# Patient Record
Sex: Female | Born: 1935 | Race: White | Hispanic: No | Marital: Married | Smoking: Never smoker
Health system: Southern US, Community
[De-identification: ages and names within clinical notes are randomized; demographics above are authoritative.]

## PROBLEM LIST (undated history)

## (undated) DIAGNOSIS — I639 Cerebral infarction, unspecified: Secondary | ICD-10-CM

## (undated) DIAGNOSIS — I10 Essential (primary) hypertension: Secondary | ICD-10-CM

## (undated) DIAGNOSIS — K5190 Ulcerative colitis, unspecified, without complications: Secondary | ICD-10-CM

## (undated) HISTORY — PX: TONSILLECTOMY: SUR1361

## (undated) HISTORY — PX: MASTECTOMY: SHX3

---

## 2000-06-23 ENCOUNTER — Ambulatory Visit (HOSPITAL_COMMUNITY): Admission: RE | Admit: 2000-06-23 | Discharge: 2000-06-23 | Payer: Self-pay | Admitting: Neurology

## 2000-06-23 ENCOUNTER — Encounter: Payer: Self-pay | Admitting: Neurology

## 2000-07-26 ENCOUNTER — Ambulatory Visit (HOSPITAL_COMMUNITY): Admission: RE | Admit: 2000-07-26 | Discharge: 2000-07-26 | Payer: Self-pay | Admitting: Neurology

## 2000-10-21 ENCOUNTER — Ambulatory Visit (HOSPITAL_COMMUNITY): Admission: RE | Admit: 2000-10-21 | Discharge: 2000-10-21 | Payer: Self-pay | Admitting: Neurosurgery

## 2000-10-21 ENCOUNTER — Encounter: Payer: Self-pay | Admitting: Neurosurgery

## 2001-04-26 ENCOUNTER — Encounter: Payer: Self-pay | Admitting: Neurology

## 2001-04-26 ENCOUNTER — Ambulatory Visit (HOSPITAL_COMMUNITY): Admission: RE | Admit: 2001-04-26 | Discharge: 2001-04-26 | Payer: Self-pay | Admitting: Neurology

## 2001-11-30 ENCOUNTER — Ambulatory Visit (HOSPITAL_COMMUNITY): Admission: RE | Admit: 2001-11-30 | Discharge: 2001-11-30 | Payer: Self-pay | Admitting: Interventional Radiology

## 2001-12-01 ENCOUNTER — Ambulatory Visit (HOSPITAL_COMMUNITY): Admission: RE | Admit: 2001-12-01 | Discharge: 2001-12-01 | Payer: Self-pay | Admitting: *Deleted

## 2002-01-31 ENCOUNTER — Inpatient Hospital Stay (HOSPITAL_COMMUNITY): Admission: AD | Admit: 2002-01-31 | Discharge: 2002-02-06 | Payer: Self-pay | Admitting: Interventional Radiology

## 2002-02-06 ENCOUNTER — Inpatient Hospital Stay (HOSPITAL_COMMUNITY)
Admission: AD | Admit: 2002-02-06 | Discharge: 2002-02-27 | Payer: Self-pay | Admitting: Physical Medicine & Rehabilitation

## 2013-12-29 ENCOUNTER — Emergency Department (HOSPITAL_BASED_OUTPATIENT_CLINIC_OR_DEPARTMENT_OTHER)
Admission: EM | Admit: 2013-12-29 | Discharge: 2013-12-29 | Disposition: A | Discharge status: Home / Self Care | Payer: Medicare Other | Attending: Emergency Medicine | Admitting: Emergency Medicine

## 2013-12-29 ENCOUNTER — Encounter (HOSPITAL_BASED_OUTPATIENT_CLINIC_OR_DEPARTMENT_OTHER): Payer: Self-pay | Admitting: Emergency Medicine

## 2013-12-29 ENCOUNTER — Emergency Department (HOSPITAL_BASED_OUTPATIENT_CLINIC_OR_DEPARTMENT_OTHER): Payer: Medicare Other

## 2013-12-29 DIAGNOSIS — Z7982 Long term (current) use of aspirin: Secondary | ICD-10-CM | POA: Insufficient documentation

## 2013-12-29 DIAGNOSIS — M26649 Arthritis of unspecified temporomandibular joint: Secondary | ICD-10-CM

## 2013-12-29 DIAGNOSIS — Z8673 Personal history of transient ischemic attack (TIA), and cerebral infarction without residual deficits: Secondary | ICD-10-CM | POA: Insufficient documentation

## 2013-12-29 DIAGNOSIS — R6884 Jaw pain: Secondary | ICD-10-CM | POA: Insufficient documentation

## 2013-12-29 DIAGNOSIS — Z79899 Other long term (current) drug therapy: Secondary | ICD-10-CM | POA: Insufficient documentation

## 2013-12-29 DIAGNOSIS — Z8719 Personal history of other diseases of the digestive system: Secondary | ICD-10-CM | POA: Insufficient documentation

## 2013-12-29 DIAGNOSIS — I10 Essential (primary) hypertension: Secondary | ICD-10-CM | POA: Insufficient documentation

## 2013-12-29 DIAGNOSIS — M2669 Other specified disorders of temporomandibular joint: Secondary | ICD-10-CM | POA: Insufficient documentation

## 2013-12-29 HISTORY — DX: Cerebral infarction, unspecified: I63.9

## 2013-12-29 HISTORY — DX: Essential (primary) hypertension: I10

## 2013-12-29 HISTORY — DX: Ulcerative colitis, unspecified, without complications: K51.90

## 2013-12-29 LAB — CBC WITH DIFFERENTIAL/PLATELET
Basophils Absolute: 0 10*3/uL (ref 0.0–0.1)
Basophils Relative: 0 % (ref 0–1)
Eosinophils Absolute: 0.1 10*3/uL (ref 0.0–0.7)
Eosinophils Relative: 2 % (ref 0–5)
HEMATOCRIT: 35.9 % — AB (ref 36.0–46.0)
Hemoglobin: 12.4 g/dL (ref 12.0–15.0)
Lymphocytes Relative: 36 % (ref 12–46)
Lymphs Abs: 2.8 10*3/uL (ref 0.7–4.0)
MCH: 31.3 pg (ref 26.0–34.0)
MCHC: 34.5 g/dL (ref 30.0–36.0)
MCV: 90.7 fL (ref 78.0–100.0)
MONO ABS: 0.6 10*3/uL (ref 0.1–1.0)
MONOS PCT: 8 % (ref 3–12)
NEUTROS ABS: 4.2 10*3/uL (ref 1.7–7.7)
NEUTROS PCT: 54 % (ref 43–77)
Platelets: 110 10*3/uL — ABNORMAL LOW (ref 150–400)
RBC: 3.96 MIL/uL (ref 3.87–5.11)
RDW: 13 % (ref 11.5–15.5)
WBC: 7.9 10*3/uL (ref 4.0–10.5)

## 2013-12-29 LAB — BASIC METABOLIC PANEL
BUN: 11 mg/dL (ref 6–23)
CHLORIDE: 98 meq/L (ref 96–112)
CO2: 24 mEq/L (ref 19–32)
Calcium: 9.2 mg/dL (ref 8.4–10.5)
Creatinine, Ser: 0.8 mg/dL (ref 0.50–1.10)
GFR calc Af Amer: 77 mL/min — ABNORMAL LOW (ref >90)
GFR calc non Af Amer: 67 mL/min — ABNORMAL LOW (ref >90)
GLUCOSE: 112 mg/dL — AB (ref 70–99)
POTASSIUM: 3.8 meq/L (ref 3.7–5.3)
Sodium: 139 mEq/L (ref 137–147)

## 2013-12-29 MED ORDER — IOHEXOL 300 MG/ML  SOLN
100.0000 mL | Freq: Once | INTRAMUSCULAR | Status: AC | PRN
  Administered 2013-12-29: 100 mL via INTRAVENOUS

## 2013-12-29 MED ORDER — TRAMADOL HCL 50 MG PO TABS
50.0000 mg | ORAL_TABLET | Freq: Once | ORAL | Status: AC
  Administered 2013-12-29: 50 mg via ORAL
  Filled 2013-12-29: qty 1

## 2013-12-29 NOTE — ED Notes (Signed)
C/o intermittent right sided jaw pain.  Reports feeling like 'lightening.'  Denies chest pain, diaphoresis, SHOB, or headache.

## 2013-12-29 NOTE — Discharge Instructions (Signed)
Arthralgia °Your caregiver has diagnosed you as suffering from an arthralgia. Arthralgia means there is pain in a joint. This can come from many reasons including: °· Bruising the joint which causes soreness (inflammation) in the joint. °· Wear and tear on the joints which occur as we grow older (osteoarthritis). °· Overusing the joint. °· Various forms of arthritis. °· Infections of the joint. °Regardless of the cause of pain in your joint, most of these different pains respond to anti-inflammatory drugs and rest. The exception to this is when a joint is infected, and these cases are treated with antibiotics, if it is a bacterial infection. °HOME CARE INSTRUCTIONS  °· Rest the injured area for as long as directed by your caregiver. Then slowly start using the joint as directed by your caregiver and as the pain allows. Crutches as directed may be useful if the ankles, knees or hips are involved. If the knee was splinted or casted, continue use and care as directed. If an stretchy or elastic wrapping bandage has been applied today, it should be removed and re-applied every 3 to 4 hours. It should not be applied tightly, but firmly enough to keep swelling down. Watch toes and feet for swelling, bluish discoloration, coldness, numbness or excessive pain. If any of these problems (symptoms) occur, remove the ace bandage and re-apply more loosely. If these symptoms persist, contact your caregiver or return to this location. °· For the first 24 hours, keep the injured extremity elevated on pillows while lying down. °· Apply ice for 15-20 minutes to the sore joint every couple hours while awake for the first half day. Then 03-04 times per day for the first 48 hours. Put the ice in a plastic bag and place a towel between the bag of ice and your skin. °· Wear any splinting, casting, elastic bandage applications, or slings as instructed. °· Only take over-the-counter or prescription medicines for pain, discomfort, or fever as  directed by your caregiver. Do not use aspirin immediately after the injury unless instructed by your physician. Aspirin can cause increased bleeding and bruising of the tissues. °· If you were given crutches, continue to use them as instructed and do not resume weight bearing on the sore joint until instructed. °Persistent pain and inability to use the sore joint as directed for more than 2 to 3 days are warning signs indicating that you should see a caregiver for a follow-up visit as soon as possible. Initially, a hairline fracture (break in bone) may not be evident on X-rays. Persistent pain and swelling indicate that further evaluation, non-weight bearing or use of the joint (use of crutches or slings as instructed), or further X-rays are indicated. X-rays may sometimes not show a small fracture until a week or 10 days later. Make a follow-up appointment with your own caregiver or one to whom we have referred you. A radiologist (specialist in reading X-rays) may read your X-rays. Make sure you know how you are to obtain your X-ray results. Do not assume everything is normal if you do not hear from us. °SEEK MEDICAL CARE IF: °Bruising, swelling, or pain increases. °SEEK IMMEDIATE MEDICAL CARE IF:  °· Your fingers or toes are numb or blue. °· The pain is not responding to medications and continues to stay the same or get worse. °· The pain in your joint becomes severe. °· You develop a fever over 102° F (38.9° C). °· It becomes impossible to move or use the joint. °MAKE SURE YOU:  °·   Understand these instructions.  Will watch your condition.  Will get help right away if you are not doing well or get worse. Document Released: 10/04/2005 Document Revised: 12/27/2011 Document Reviewed: 05/22/2008 [facility] Patient Information 2014 [place].    Dental Care and Dentist Visits Dental care supports good overall health. Regular dental visits can also help you avoid dental pain, bleeding, infection, and  other more serious health problems in the future. It is important to keep the mouth healthy because diseases in the teeth, gums, and other oral tissues can spread to other areas of the body. Some problems, such as diabetes, heart disease, and pre-term labor have been associated with poor oral health.  See your dentist every 6 months. If you experience emergency problems such as a toothache or broken tooth, go to the dentist right away. If you see your dentist regularly, you may catch problems early. It is easier to be treated for problems in the early stages.  WHAT TO EXPECT AT A DENTIST VISIT  Your dentist will look for many common oral health problems and recommend proper treatment. At your regular dental visit, you can expect:  Gentle cleaning of the teeth and gums. This includes scraping and polishing. This helps to remove the sticky substance around the teeth and gums (plaque). Plaque forms in the mouth shortly after eating. Over time, plaque hardens on the teeth as tartar. If tartar is not removed regularly, it can cause problems. Cleaning also helps remove stains.  Periodic X-rays. These pictures of the teeth and supporting bone will help your dentist assess the health of your teeth.  Periodic fluoride treatments. Fluoride is a natural mineral shown to help strengthen teeth. Fluoride treatmentinvolves applying a fluoride gel or varnish to the teeth. It is most commonly done in children.  Examination of the mouth, tongue, jaws, teeth, and gums to look for any oral health problems, such as:  Cavities (dental caries). This is decay on the tooth caused by plaque, sugar, and acid in the mouth. It is best to catch a cavity when it is small.  Inflammation of the gums caused by plaque buildup (gingivitis).  Problems with the mouth or malformed or misaligned teeth.  Oral cancer or other diseases of the soft tissues or jaws. KEEP YOUR TEETH AND GUMS HEALTHY For healthy teeth and gums, follow  these general guidelines as well as your dentist's specific advice:  Have your teeth professionally cleaned at the dentist every 6 months.  Brush twice daily with a fluoride toothpaste.  Floss your teeth daily.  Ask your dentist if you need fluoride supplements, treatments, or fluoride toothpaste.  Eat a healthy diet. Reduce foods and drinks with added sugar.  Avoid smoking. TREATMENT FOR ORAL HEALTH PROBLEMS If you have oral health problems, treatment varies depending on the conditions present in your teeth and gums.  Your caregiver will most likely recommend good oral hygiene at each visit.  For cavities, gingivitis, or other oral health disease, your caregiver will perform a procedure to treat the problem. This is typically done at a separate appointment. Sometimes your caregiver will refer you to another dental specialist for specific tooth problems or for surgery. SEEK IMMEDIATE DENTAL CARE IF:  You have pain, bleeding, or soreness in the gum, tooth, jaw, or mouth area.  A permanent tooth becomes loose or separated from the gum socket.  You experience a blow or injury to the mouth or jaw area. Document Released: 06/16/2011 Document Revised: 12/27/2011 Document Reviewed: 06/16/2011 [employer] Patient  Information ©2014 [employer]. ° °

## 2013-12-29 NOTE — ED Provider Notes (Signed)
CSN: [id number]     Arrival date & time 12/29/13  1349 History  This chart was scribed for Ephraim Hamburger, MD by Mercy Moore, ED scribe.  This patient was seen in room MH12/MH12 and the patient's care was started at 3:21 PM.   Chief Complaint  Patient presents with  . Jaw Pain      HPI HPI Comments: Elizabeth Valdez is a 80 y.o. female who presents to the Emergency Department complaining of right sided jaw pain, onset three days. Patient reports that the pain has been intermittent until today. Today the pain has severely worsened and has become constant. Patient reports having similar pain months ago and a dentist diagnosed her "jaw joint pain." Patient reports having arthritis and suspects that it may be contributing to her jaw pain. Patient denies redness or swelling of the area, pain in her teeth, and pain with movement, chewing or swallowing. However, patient states her dentist advised her not to chew during her previous episode. Patient reports that a heating pad and warm food does alleviate her pain. Yesterday, Ultram aided in mitigating her pain but has not provided any relief today. No exertional pain. No chest pain, shortness of breath, nausea or diaphoresis.  Past Medical History  Diagnosis Date  . Hypertension   . Ulcerative colitis   . Stroke    Past Surgical History  Procedure Laterality Date  . Mastectomy    . Tonsillectomy     No family history on file. History  Substance Use Topics  . Smoking status: Never Smoker   . Smokeless tobacco: Not on file  . Alcohol Use: No   OB History   Grav Para Term Preterm Abortions TAB SAB Ect Mult Living                 Review of Systems  Constitutional: Negative for fever, chills and diaphoresis.  HENT: Negative for sore throat and trouble swallowing.   Respiratory: Negative for cough and shortness of breath.   Cardiovascular: Negative for chest pain.  Gastrointestinal: Negative for nausea.  All other systems reviewed and  are negative.      Allergies  Codeine; Sulfa antibiotics; and Talwin  Home Medications   Current Outpatient Rx  Name  Route  Sig  Dispense  Refill  . albuterol (PROVENTIL) (2.5 MG/3ML) 0.083% nebulizer solution   Nebulization   Take 2.5 mg by nebulization every 6 (six) hours as needed for wheezing or shortness of breath.         Marland Kitchen aspirin 81 MG tablet   Oral   Take 81 mg by mouth daily.         . cetirizine (ZYRTEC) 10 MG tablet   Oral   Take 10 mg by mouth daily.         . clotrimazole (LOTRIMIN) 1 % cream   Topical   Apply 1 application topically 2 (two) times daily.         . furosemide (LASIX) 40 MG tablet   Oral   Take 40 mg by mouth daily.         . hydrALAZINE (APRESOLINE) 25 MG tablet   Oral   Take 25 mg by mouth 2 (two) times daily.         . hydrochlorothiazide (MICROZIDE) 12.5 MG capsule   Oral   Take 12.5 mg by mouth daily.         . irbesartan (AVAPRO) 300 MG tablet   Oral   Take 300  mg by mouth daily.         . metoprolol succinate (TOPROL-XL) 100 MG 24 hr tablet   Oral   Take 150 mg by mouth daily. Take with or immediately following a meal.         . naproxen sodium (ANAPROX) 220 MG tablet   Oral   Take 220 mg by mouth 2 (two) times daily with a meal.         . omeprazole (PRILOSEC) 20 MG capsule   Oral   Take 20 mg by mouth daily.         . potassium chloride SA (K-DUR,KLOR-CON) 20 MEQ tablet   Oral   Take 20 mEq by mouth 2 (two) times daily.         . sertraline (ZOLOFT) 50 MG tablet   Oral   Take 50 mg by mouth daily.         . simvastatin (ZOCOR) 20 MG tablet   Oral   Take 20 mg by mouth daily.         . traMADol (ULTRAM) 50 MG tablet   Oral   Take 50 mg by mouth every 6 (six) hours as needed.         . travoprost, benzalkonium, (TRAVATAN) 0.004 % ophthalmic solution   Both Eyes   Place 1 drop into both eyes at bedtime.          Triage Vitals: BP 184/105  Temp(Src) 98.1 F (36.7 C)  (Oral)  Resp 18  Ht 5\' 2"  (1.575 m)  Wt 168 lb (76.204 kg)  BMI 30.72 kg/m2  SpO2 92% Physical Exam  Nursing note and vitals reviewed. Constitutional: She is oriented to person, place, and time. She appears well-developed and well-nourished. No distress.  HENT:  Head: Normocephalic and atraumatic.  Right mandibular tenderness. Generally poor dentition with multiple caries and fillings. No acute abscess, erythema or tenderness  Neck: Neck supple. No tracheal deviation present.  Cardiovascular: Normal rate, regular rhythm and normal heart sounds.   Pulmonary/Chest: Effort normal and breath sounds normal. No respiratory distress.  Neurological: She is alert and oriented to person, place, and time.  Skin: Skin is warm and dry. No erythema.  Psychiatric: She has a normal mood and affect. Her behavior is normal.    ED Course  Procedures (including critical care time) DIAGNOSTIC STUDIES: Oxygen Saturation is 92% on room air, low by my interpretation.    COORDINATION OF CARE: 3:27 PM- Will order CT scan to rule out possible infection, IV fluids, and labs. Pt advised of plan for treatment and pt agrees.    Labs Review Labs Reviewed  CBC WITH DIFFERENTIAL - Abnormal; Notable for the following:    HCT 35.9 (*)    Platelets 110 (*)    All other components within normal limits  BASIC METABOLIC PANEL - Abnormal; Notable for the following:    Glucose, Bld 112 (*)    GFR calc non Af Amer 67 (*)    GFR calc Af Amer 77 (*)    All other components within normal limits   Imaging Review Ct Maxillofacial W/cm  12/29/2013   CLINICAL DATA:  Right jaw pain. Normal white blood count. 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  EXAM: CT MAXILLOFACIAL WITH CONTRAST  TECHNIQUE: Multidetector CT imaging of the maxillofacial structures was performed with intravenous contrast. Multiplanar CT image reconstructions were also generated. A small metallic BB was  placed  on the right temple in order to reliably differentiate right from left.  CONTRAST:  183mL OMNIPAQUE IOHEXOL 300 MG/ML  SOLN  COMPARISON:  MRI a 02/03/2012 and head CT 02/03/2012  FINDINGS: There is streak artifact from multiple dental fillings. Both mandibular condyles are located. There is some flattening of the condylar process of the right mandible with some subchondral sclerosis suggesting temporomandibular joint degenerative disease. The mandible shows no evidence of cortical destruction, erosion, or fracture. No periapical lucencies are seen in the mandible. There is a periapical lucency about a left maxillary molar. See image number 31 of the coronal images and image 39 of axial images.  No inflammatory changes are seen within the soft tissues about the maxilla or mandible or remainder of the face.  There are no air-fluid levels or significant disease in the paranasal sinuses. There is scattered ethmoid and left maxillary and mucosal thickening. The mastoid air cells and middle ears are clear. The orbits are symmetric and unremarkable.  There is an approximately 7.3 x 8 mm aneurysm extending from the A1 segment of the right anterior cerebral artery. On an MRA dated 02/03/2012, this was described as being 7 mm and is therefore very similar in size. Negative for hydrocephalus or midline shift. Cerebral atrophy is noted.  IMPRESSION: 1.  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 2. No focal inflammatory changes. No evidence of acute fracture or bony destruction. 3. Periapical lucency of a left maxillary molar. 4. The patient's known right anterior cerebral artery (A1 segment) aneurysm measures 7 x 8 mm on today's study. This is without significant change compared to an MRA dated 02/03/2012.   Electronically Signed   By: Curlene Dolphin M.D.   On: 12/29/2013 17:37     EKG Interpretation   Date/Time:  Saturday December 29 2013 14:32:30 EDT Ventricular Rate:  66 PR Interval:  150 QRS  Duration: 114 QT Interval:  478 QTC Calculation: 501 R Axis:   76 Text Interpretation:  Normal sinus rhythm Septal infarct , age  undetermined ST \\T \ T wave abnormality, consider inferior ischemia  prolonged QTC Abnormal ECG Left bundle branch block is new since 2003  Confirmed by Anela Bensman  MD, Nicollette Wilhelmi ([id number]) on 12/29/2013 3:08:10 PM      MDM   Final diagnoses:  Jaw pain  TMJ arthritis    Patient is well appearing with point tenderness over mid right mandible. Burtis Junes this is arthritic in nature. Given her poor dentition and acute worsening, a CT was evaluated to r/o infection or osteo. CT shows TMJ arthritis. The left upper molar with periapical luceny has benign appearance on re-exam, no tenderness or pain there. She has chronic problems with that tooth and is following up soon with her dentist. No signs of other acute periodontal disease. Will use heat packs with caution and continue tramadol as needed for pain. There is no acute ischemia on her EKG and her history and exam point to this being MSK in nature, not ACS.  I personally performed the services described in this documentation, which was scribed in my presence. The recorded information has been reviewed and is accurate.    Ephraim Hamburger, MD 12/30/13 [phone]

## 2016-12-21 DIAGNOSIS — R197 Diarrhea, unspecified: Secondary | ICD-10-CM | POA: Diagnosis not present

## 2016-12-21 DIAGNOSIS — R278 Other lack of coordination: Secondary | ICD-10-CM | POA: Diagnosis not present

## 2016-12-21 DIAGNOSIS — R293 Abnormal posture: Secondary | ICD-10-CM | POA: Diagnosis not present

## 2016-12-21 DIAGNOSIS — I69154 Hemiplegia and hemiparesis following nontraumatic intracerebral hemorrhage affecting left non-dominant side: Secondary | ICD-10-CM | POA: Diagnosis not present

## 2016-12-21 DIAGNOSIS — M12812 Other specific arthropathies, not elsewhere classified, left shoulder: Secondary | ICD-10-CM | POA: Diagnosis not present

## 2017-01-02 DIAGNOSIS — I214 Non-ST elevation (NSTEMI) myocardial infarction: Secondary | ICD-10-CM | POA: Diagnosis not present

## 2017-01-02 DIAGNOSIS — Z8673 Personal history of transient ischemic attack (TIA), and cerebral infarction without residual deficits: Secondary | ICD-10-CM | POA: Diagnosis not present

## 2017-01-02 DIAGNOSIS — K219 Gastro-esophageal reflux disease without esophagitis: Secondary | ICD-10-CM | POA: Diagnosis not present

## 2017-01-02 DIAGNOSIS — Z9842 Cataract extraction status, left eye: Secondary | ICD-10-CM | POA: Diagnosis not present

## 2017-01-02 DIAGNOSIS — M1990 Unspecified osteoarthritis, unspecified site: Secondary | ICD-10-CM | POA: Diagnosis not present

## 2017-01-02 DIAGNOSIS — E782 Mixed hyperlipidemia: Secondary | ICD-10-CM | POA: Diagnosis not present

## 2017-01-02 DIAGNOSIS — R0602 Shortness of breath: Secondary | ICD-10-CM | POA: Diagnosis not present

## 2017-01-02 DIAGNOSIS — Z7982 Long term (current) use of aspirin: Secondary | ICD-10-CM | POA: Diagnosis not present

## 2017-01-02 DIAGNOSIS — Z6826 Body mass index (BMI) 26.0-26.9, adult: Secondary | ICD-10-CM | POA: Diagnosis not present

## 2017-01-02 DIAGNOSIS — Z993 Dependence on wheelchair: Secondary | ICD-10-CM | POA: Diagnosis not present

## 2017-01-02 DIAGNOSIS — I69352 Hemiplegia and hemiparesis following cerebral infarction affecting left dominant side: Secondary | ICD-10-CM | POA: Diagnosis not present

## 2017-01-02 DIAGNOSIS — R197 Diarrhea, unspecified: Secondary | ICD-10-CM | POA: Diagnosis not present

## 2017-01-02 DIAGNOSIS — E119 Type 2 diabetes mellitus without complications: Secondary | ICD-10-CM | POA: Diagnosis not present

## 2017-01-02 DIAGNOSIS — I10 Essential (primary) hypertension: Secondary | ICD-10-CM | POA: Diagnosis not present

## 2017-01-02 DIAGNOSIS — F4323 Adjustment disorder with mixed anxiety and depressed mood: Secondary | ICD-10-CM | POA: Diagnosis not present

## 2017-01-02 DIAGNOSIS — Z9012 Acquired absence of left breast and nipple: Secondary | ICD-10-CM | POA: Diagnosis not present

## 2017-01-02 DIAGNOSIS — M810 Age-related osteoporosis without current pathological fracture: Secondary | ICD-10-CM | POA: Diagnosis not present

## 2017-01-02 DIAGNOSIS — Z853 Personal history of malignant neoplasm of breast: Secondary | ICD-10-CM | POA: Diagnosis not present

## 2017-01-02 DIAGNOSIS — E1139 Type 2 diabetes mellitus with other diabetic ophthalmic complication: Secondary | ICD-10-CM | POA: Diagnosis not present

## 2017-01-02 DIAGNOSIS — H409 Unspecified glaucoma: Secondary | ICD-10-CM | POA: Diagnosis not present

## 2017-01-02 DIAGNOSIS — I161 Hypertensive emergency: Secondary | ICD-10-CM | POA: Diagnosis not present

## 2017-01-02 DIAGNOSIS — Z9841 Cataract extraction status, right eye: Secondary | ICD-10-CM | POA: Diagnosis not present

## 2017-01-02 DIAGNOSIS — I119 Hypertensive heart disease without heart failure: Secondary | ICD-10-CM | POA: Diagnosis not present

## 2017-01-02 DIAGNOSIS — Z961 Presence of intraocular lens: Secondary | ICD-10-CM | POA: Diagnosis not present

## 2017-01-02 DIAGNOSIS — Z79818 Long term (current) use of other agents affecting estrogen receptors and estrogen levels: Secondary | ICD-10-CM | POA: Diagnosis not present

## 2017-01-02 DIAGNOSIS — R079 Chest pain, unspecified: Secondary | ICD-10-CM | POA: Diagnosis not present

## 2017-01-03 DIAGNOSIS — I119 Hypertensive heart disease without heart failure: Secondary | ICD-10-CM | POA: Diagnosis not present

## 2017-01-03 DIAGNOSIS — E119 Type 2 diabetes mellitus without complications: Secondary | ICD-10-CM | POA: Diagnosis not present

## 2017-01-03 DIAGNOSIS — I214 Non-ST elevation (NSTEMI) myocardial infarction: Secondary | ICD-10-CM | POA: Diagnosis not present

## 2017-01-03 DIAGNOSIS — I517 Cardiomegaly: Secondary | ICD-10-CM | POA: Diagnosis not present

## 2017-01-03 DIAGNOSIS — I639 Cerebral infarction, unspecified: Secondary | ICD-10-CM | POA: Diagnosis not present

## 2017-01-03 DIAGNOSIS — I2119 ST elevation (STEMI) myocardial infarction involving other coronary artery of inferior wall: Secondary | ICD-10-CM | POA: Diagnosis not present

## 2017-01-03 DIAGNOSIS — I4581 Long QT syndrome: Secondary | ICD-10-CM | POA: Diagnosis not present

## 2017-01-03 DIAGNOSIS — I340 Nonrheumatic mitral (valve) insufficiency: Secondary | ICD-10-CM | POA: Diagnosis not present

## 2017-01-04 DIAGNOSIS — Z6824 Body mass index (BMI) 24.0-24.9, adult: Secondary | ICD-10-CM | POA: Diagnosis not present

## 2017-01-04 DIAGNOSIS — R197 Diarrhea, unspecified: Secondary | ICD-10-CM | POA: Diagnosis not present

## 2017-01-04 DIAGNOSIS — K219 Gastro-esophageal reflux disease without esophagitis: Secondary | ICD-10-CM | POA: Diagnosis not present

## 2017-01-04 DIAGNOSIS — I214 Non-ST elevation (NSTEMI) myocardial infarction: Secondary | ICD-10-CM | POA: Diagnosis not present

## 2017-01-04 DIAGNOSIS — I69359 Hemiplegia and hemiparesis following cerebral infarction affecting unspecified side: Secondary | ICD-10-CM | POA: Diagnosis not present

## 2017-01-04 DIAGNOSIS — I10 Essential (primary) hypertension: Secondary | ICD-10-CM | POA: Diagnosis not present

## 2017-01-04 DIAGNOSIS — F4323 Adjustment disorder with mixed anxiety and depressed mood: Secondary | ICD-10-CM | POA: Diagnosis not present

## 2017-01-04 DIAGNOSIS — I161 Hypertensive emergency: Secondary | ICD-10-CM | POA: Diagnosis not present

## 2017-01-04 DIAGNOSIS — Z8673 Personal history of transient ischemic attack (TIA), and cerebral infarction without residual deficits: Secondary | ICD-10-CM | POA: Diagnosis not present

## 2017-01-04 DIAGNOSIS — I119 Hypertensive heart disease without heart failure: Secondary | ICD-10-CM | POA: Diagnosis not present

## 2017-01-04 DIAGNOSIS — G8190 Hemiplegia, unspecified affecting unspecified side: Secondary | ICD-10-CM | POA: Diagnosis not present

## 2017-01-04 DIAGNOSIS — E119 Type 2 diabetes mellitus without complications: Secondary | ICD-10-CM | POA: Diagnosis not present

## 2017-01-12 DIAGNOSIS — I10 Essential (primary) hypertension: Secondary | ICD-10-CM | POA: Diagnosis not present

## 2017-01-12 DIAGNOSIS — K219 Gastro-esophageal reflux disease without esophagitis: Secondary | ICD-10-CM | POA: Diagnosis not present

## 2017-01-12 DIAGNOSIS — I69359 Hemiplegia and hemiparesis following cerebral infarction affecting unspecified side: Secondary | ICD-10-CM | POA: Diagnosis not present

## 2017-01-12 DIAGNOSIS — K52831 Collagenous colitis: Secondary | ICD-10-CM | POA: Diagnosis not present

## 2017-01-12 DIAGNOSIS — I252 Old myocardial infarction: Secondary | ICD-10-CM | POA: Diagnosis not present

## 2017-01-20 DIAGNOSIS — M542 Cervicalgia: Secondary | ICD-10-CM | POA: Diagnosis not present

## 2017-01-20 DIAGNOSIS — G8194 Hemiplegia, unspecified affecting left nondominant side: Secondary | ICD-10-CM | POA: Diagnosis not present

## 2017-01-20 DIAGNOSIS — I69359 Hemiplegia and hemiparesis following cerebral infarction affecting unspecified side: Secondary | ICD-10-CM | POA: Diagnosis not present

## 2017-01-20 DIAGNOSIS — Z6826 Body mass index (BMI) 26.0-26.9, adult: Secondary | ICD-10-CM | POA: Diagnosis not present

## 2017-01-20 DIAGNOSIS — Z8679 Personal history of other diseases of the circulatory system: Secondary | ICD-10-CM | POA: Diagnosis not present

## 2017-01-20 DIAGNOSIS — I252 Old myocardial infarction: Secondary | ICD-10-CM | POA: Diagnosis not present

## 2017-01-20 DIAGNOSIS — I214 Non-ST elevation (NSTEMI) myocardial infarction: Secondary | ICD-10-CM | POA: Diagnosis not present

## 2017-01-20 DIAGNOSIS — G8190 Hemiplegia, unspecified affecting unspecified side: Secondary | ICD-10-CM | POA: Diagnosis not present

## 2017-01-20 DIAGNOSIS — E782 Mixed hyperlipidemia: Secondary | ICD-10-CM | POA: Diagnosis not present

## 2017-01-20 DIAGNOSIS — S92811D Other fracture of right foot, subsequent encounter for fracture with routine healing: Secondary | ICD-10-CM | POA: Diagnosis not present

## 2017-01-20 DIAGNOSIS — I10 Essential (primary) hypertension: Secondary | ICD-10-CM | POA: Diagnosis not present

## 2017-01-20 DIAGNOSIS — R0689 Other abnormalities of breathing: Secondary | ICD-10-CM | POA: Diagnosis not present

## 2017-01-24 DIAGNOSIS — E785 Hyperlipidemia, unspecified: Secondary | ICD-10-CM | POA: Diagnosis not present

## 2017-01-24 DIAGNOSIS — K769 Liver disease, unspecified: Secondary | ICD-10-CM | POA: Diagnosis not present

## 2017-02-19 DIAGNOSIS — R0689 Other abnormalities of breathing: Secondary | ICD-10-CM | POA: Diagnosis not present

## 2017-02-19 DIAGNOSIS — G8194 Hemiplegia, unspecified affecting left nondominant side: Secondary | ICD-10-CM | POA: Diagnosis not present

## 2017-02-19 DIAGNOSIS — I69359 Hemiplegia and hemiparesis following cerebral infarction affecting unspecified side: Secondary | ICD-10-CM | POA: Diagnosis not present

## 2017-02-19 DIAGNOSIS — M542 Cervicalgia: Secondary | ICD-10-CM | POA: Diagnosis not present

## 2017-02-19 DIAGNOSIS — I252 Old myocardial infarction: Secondary | ICD-10-CM | POA: Diagnosis not present

## 2017-02-19 DIAGNOSIS — S92811D Other fracture of right foot, subsequent encounter for fracture with routine healing: Secondary | ICD-10-CM | POA: Diagnosis not present

## 2017-02-22 DIAGNOSIS — H04123 Dry eye syndrome of bilateral lacrimal glands: Secondary | ICD-10-CM | POA: Diagnosis not present

## 2017-02-22 DIAGNOSIS — Z961 Presence of intraocular lens: Secondary | ICD-10-CM | POA: Diagnosis not present

## 2017-02-22 DIAGNOSIS — H401134 Primary open-angle glaucoma, bilateral, indeterminate stage: Secondary | ICD-10-CM | POA: Diagnosis not present

## 2017-03-21 DIAGNOSIS — M12811 Other specific arthropathies, not elsewhere classified, right shoulder: Secondary | ICD-10-CM | POA: Diagnosis not present

## 2017-03-21 DIAGNOSIS — M12812 Other specific arthropathies, not elsewhere classified, left shoulder: Secondary | ICD-10-CM | POA: Diagnosis not present

## 2017-03-22 DIAGNOSIS — R0689 Other abnormalities of breathing: Secondary | ICD-10-CM | POA: Diagnosis not present

## 2017-03-22 DIAGNOSIS — M542 Cervicalgia: Secondary | ICD-10-CM | POA: Diagnosis not present

## 2017-03-22 DIAGNOSIS — I252 Old myocardial infarction: Secondary | ICD-10-CM | POA: Diagnosis not present

## 2017-03-22 DIAGNOSIS — G8194 Hemiplegia, unspecified affecting left nondominant side: Secondary | ICD-10-CM | POA: Diagnosis not present

## 2017-03-22 DIAGNOSIS — I69359 Hemiplegia and hemiparesis following cerebral infarction affecting unspecified side: Secondary | ICD-10-CM | POA: Diagnosis not present

## 2017-03-22 DIAGNOSIS — S92811D Other fracture of right foot, subsequent encounter for fracture with routine healing: Secondary | ICD-10-CM | POA: Diagnosis not present

## 2017-04-14 DIAGNOSIS — E871 Hypo-osmolality and hyponatremia: Secondary | ICD-10-CM | POA: Diagnosis not present

## 2017-04-14 DIAGNOSIS — G8194 Hemiplegia, unspecified affecting left nondominant side: Secondary | ICD-10-CM | POA: Diagnosis not present

## 2017-04-14 DIAGNOSIS — I10 Essential (primary) hypertension: Secondary | ICD-10-CM | POA: Diagnosis not present

## 2017-04-14 DIAGNOSIS — I252 Old myocardial infarction: Secondary | ICD-10-CM | POA: Diagnosis not present

## 2017-04-21 DIAGNOSIS — G8194 Hemiplegia, unspecified affecting left nondominant side: Secondary | ICD-10-CM | POA: Diagnosis not present

## 2017-04-21 DIAGNOSIS — I69359 Hemiplegia and hemiparesis following cerebral infarction affecting unspecified side: Secondary | ICD-10-CM | POA: Diagnosis not present

## 2017-04-21 DIAGNOSIS — I252 Old myocardial infarction: Secondary | ICD-10-CM | POA: Diagnosis not present

## 2017-04-21 DIAGNOSIS — S92811D Other fracture of right foot, subsequent encounter for fracture with routine healing: Secondary | ICD-10-CM | POA: Diagnosis not present

## 2017-04-21 DIAGNOSIS — R0689 Other abnormalities of breathing: Secondary | ICD-10-CM | POA: Diagnosis not present

## 2017-04-21 DIAGNOSIS — M542 Cervicalgia: Secondary | ICD-10-CM | POA: Diagnosis not present

## 2017-05-03 DIAGNOSIS — I2510 Atherosclerotic heart disease of native coronary artery without angina pectoris: Secondary | ICD-10-CM | POA: Diagnosis not present

## 2017-05-03 DIAGNOSIS — I252 Old myocardial infarction: Secondary | ICD-10-CM | POA: Diagnosis not present

## 2017-05-03 DIAGNOSIS — E782 Mixed hyperlipidemia: Secondary | ICD-10-CM | POA: Diagnosis not present

## 2017-05-03 DIAGNOSIS — I10 Essential (primary) hypertension: Secondary | ICD-10-CM | POA: Diagnosis not present

## 2017-05-03 DIAGNOSIS — G453 Amaurosis fugax: Secondary | ICD-10-CM | POA: Diagnosis not present

## 2017-05-03 DIAGNOSIS — I69359 Hemiplegia and hemiparesis following cerebral infarction affecting unspecified side: Secondary | ICD-10-CM | POA: Diagnosis not present

## 2017-05-03 DIAGNOSIS — G8190 Hemiplegia, unspecified affecting unspecified side: Secondary | ICD-10-CM | POA: Diagnosis not present

## 2017-05-04 DIAGNOSIS — L820 Inflamed seborrheic keratosis: Secondary | ICD-10-CM | POA: Diagnosis not present

## 2017-05-04 DIAGNOSIS — L821 Other seborrheic keratosis: Secondary | ICD-10-CM | POA: Diagnosis not present

## 2017-05-04 DIAGNOSIS — D485 Neoplasm of uncertain behavior of skin: Secondary | ICD-10-CM | POA: Diagnosis not present

## 2017-05-04 DIAGNOSIS — C44622 Squamous cell carcinoma of skin of right upper limb, including shoulder: Secondary | ICD-10-CM | POA: Diagnosis not present

## 2017-05-04 DIAGNOSIS — L814 Other melanin hyperpigmentation: Secondary | ICD-10-CM | POA: Diagnosis not present

## 2017-05-11 DIAGNOSIS — R1312 Dysphagia, oropharyngeal phase: Secondary | ICD-10-CM | POA: Diagnosis not present

## 2017-05-11 DIAGNOSIS — R1310 Dysphagia, unspecified: Secondary | ICD-10-CM | POA: Diagnosis not present

## 2017-05-11 DIAGNOSIS — R2689 Other abnormalities of gait and mobility: Secondary | ICD-10-CM | POA: Diagnosis not present

## 2017-05-11 DIAGNOSIS — M6281 Muscle weakness (generalized): Secondary | ICD-10-CM | POA: Diagnosis not present

## 2017-05-22 DIAGNOSIS — R0689 Other abnormalities of breathing: Secondary | ICD-10-CM | POA: Diagnosis not present

## 2017-05-22 DIAGNOSIS — M542 Cervicalgia: Secondary | ICD-10-CM | POA: Diagnosis not present

## 2017-05-22 DIAGNOSIS — S92811D Other fracture of right foot, subsequent encounter for fracture with routine healing: Secondary | ICD-10-CM | POA: Diagnosis not present

## 2017-05-22 DIAGNOSIS — I69359 Hemiplegia and hemiparesis following cerebral infarction affecting unspecified side: Secondary | ICD-10-CM | POA: Diagnosis not present

## 2017-05-22 DIAGNOSIS — G8194 Hemiplegia, unspecified affecting left nondominant side: Secondary | ICD-10-CM | POA: Diagnosis not present

## 2017-05-22 DIAGNOSIS — I252 Old myocardial infarction: Secondary | ICD-10-CM | POA: Diagnosis not present

## 2017-06-01 DIAGNOSIS — C44622 Squamous cell carcinoma of skin of right upper limb, including shoulder: Secondary | ICD-10-CM | POA: Diagnosis not present

## 2017-06-22 DIAGNOSIS — S92811D Other fracture of right foot, subsequent encounter for fracture with routine healing: Secondary | ICD-10-CM | POA: Diagnosis not present

## 2017-06-22 DIAGNOSIS — I69359 Hemiplegia and hemiparesis following cerebral infarction affecting unspecified side: Secondary | ICD-10-CM | POA: Diagnosis not present

## 2017-06-22 DIAGNOSIS — R0689 Other abnormalities of breathing: Secondary | ICD-10-CM | POA: Diagnosis not present

## 2017-06-22 DIAGNOSIS — G8194 Hemiplegia, unspecified affecting left nondominant side: Secondary | ICD-10-CM | POA: Diagnosis not present

## 2017-06-22 DIAGNOSIS — M542 Cervicalgia: Secondary | ICD-10-CM | POA: Diagnosis not present

## 2017-06-22 DIAGNOSIS — I252 Old myocardial infarction: Secondary | ICD-10-CM | POA: Diagnosis not present

## 2017-06-28 DIAGNOSIS — M12812 Other specific arthropathies, not elsewhere classified, left shoulder: Secondary | ICD-10-CM | POA: Diagnosis not present

## 2017-06-28 DIAGNOSIS — M75101 Unspecified rotator cuff tear or rupture of right shoulder, not specified as traumatic: Secondary | ICD-10-CM | POA: Diagnosis not present

## 2017-06-28 DIAGNOSIS — M12811 Other specific arthropathies, not elsewhere classified, right shoulder: Secondary | ICD-10-CM | POA: Diagnosis not present

## 2017-07-15 DIAGNOSIS — E871 Hypo-osmolality and hyponatremia: Secondary | ICD-10-CM | POA: Diagnosis not present

## 2017-07-15 DIAGNOSIS — I10 Essential (primary) hypertension: Secondary | ICD-10-CM | POA: Diagnosis not present

## 2017-07-15 DIAGNOSIS — G8194 Hemiplegia, unspecified affecting left nondominant side: Secondary | ICD-10-CM | POA: Diagnosis not present

## 2017-07-22 DIAGNOSIS — I69359 Hemiplegia and hemiparesis following cerebral infarction affecting unspecified side: Secondary | ICD-10-CM | POA: Diagnosis not present

## 2017-07-22 DIAGNOSIS — R0689 Other abnormalities of breathing: Secondary | ICD-10-CM | POA: Diagnosis not present

## 2017-07-22 DIAGNOSIS — S92811D Other fracture of right foot, subsequent encounter for fracture with routine healing: Secondary | ICD-10-CM | POA: Diagnosis not present

## 2017-07-22 DIAGNOSIS — G8194 Hemiplegia, unspecified affecting left nondominant side: Secondary | ICD-10-CM | POA: Diagnosis not present

## 2017-07-22 DIAGNOSIS — M542 Cervicalgia: Secondary | ICD-10-CM | POA: Diagnosis not present

## 2017-07-22 DIAGNOSIS — I252 Old myocardial infarction: Secondary | ICD-10-CM | POA: Diagnosis not present

## 2017-08-02 DIAGNOSIS — Z23 Encounter for immunization: Secondary | ICD-10-CM | POA: Diagnosis not present

## 2017-08-18 DIAGNOSIS — D485 Neoplasm of uncertain behavior of skin: Secondary | ICD-10-CM | POA: Diagnosis not present

## 2017-08-18 DIAGNOSIS — C44319 Basal cell carcinoma of skin of other parts of face: Secondary | ICD-10-CM | POA: Diagnosis not present

## 2017-08-18 DIAGNOSIS — C44622 Squamous cell carcinoma of skin of right upper limb, including shoulder: Secondary | ICD-10-CM | POA: Diagnosis not present

## 2017-08-18 DIAGNOSIS — C44329 Squamous cell carcinoma of skin of other parts of face: Secondary | ICD-10-CM | POA: Diagnosis not present

## 2017-08-19 DIAGNOSIS — I10 Essential (primary) hypertension: Secondary | ICD-10-CM | POA: Diagnosis not present

## 2017-08-22 DIAGNOSIS — G8194 Hemiplegia, unspecified affecting left nondominant side: Secondary | ICD-10-CM | POA: Diagnosis not present

## 2017-08-22 DIAGNOSIS — I252 Old myocardial infarction: Secondary | ICD-10-CM | POA: Diagnosis not present

## 2017-08-22 DIAGNOSIS — M542 Cervicalgia: Secondary | ICD-10-CM | POA: Diagnosis not present

## 2017-08-22 DIAGNOSIS — R0689 Other abnormalities of breathing: Secondary | ICD-10-CM | POA: Diagnosis not present

## 2017-08-22 DIAGNOSIS — S92811D Other fracture of right foot, subsequent encounter for fracture with routine healing: Secondary | ICD-10-CM | POA: Diagnosis not present

## 2017-08-22 DIAGNOSIS — I69359 Hemiplegia and hemiparesis following cerebral infarction affecting unspecified side: Secondary | ICD-10-CM | POA: Diagnosis not present

## 2017-09-14 DIAGNOSIS — M6281 Muscle weakness (generalized): Secondary | ICD-10-CM | POA: Diagnosis not present

## 2017-09-14 DIAGNOSIS — R2689 Other abnormalities of gait and mobility: Secondary | ICD-10-CM | POA: Diagnosis not present

## 2017-09-14 DIAGNOSIS — R1312 Dysphagia, oropharyngeal phase: Secondary | ICD-10-CM | POA: Diagnosis not present

## 2017-09-14 DIAGNOSIS — M25561 Pain in right knee: Secondary | ICD-10-CM | POA: Diagnosis not present

## 2017-09-19 DIAGNOSIS — M6281 Muscle weakness (generalized): Secondary | ICD-10-CM | POA: Diagnosis not present

## 2017-09-19 DIAGNOSIS — R2689 Other abnormalities of gait and mobility: Secondary | ICD-10-CM | POA: Diagnosis not present

## 2017-09-19 DIAGNOSIS — R1312 Dysphagia, oropharyngeal phase: Secondary | ICD-10-CM | POA: Diagnosis not present

## 2017-09-19 DIAGNOSIS — R278 Other lack of coordination: Secondary | ICD-10-CM | POA: Diagnosis not present

## 2017-09-19 DIAGNOSIS — M25561 Pain in right knee: Secondary | ICD-10-CM | POA: Diagnosis not present

## 2017-09-20 DIAGNOSIS — D0439 Carcinoma in situ of skin of other parts of face: Secondary | ICD-10-CM | POA: Diagnosis not present

## 2017-09-20 DIAGNOSIS — C44319 Basal cell carcinoma of skin of other parts of face: Secondary | ICD-10-CM | POA: Diagnosis not present

## 2017-09-21 DIAGNOSIS — S92811D Other fracture of right foot, subsequent encounter for fracture with routine healing: Secondary | ICD-10-CM | POA: Diagnosis not present

## 2017-09-21 DIAGNOSIS — I252 Old myocardial infarction: Secondary | ICD-10-CM | POA: Diagnosis not present

## 2017-09-21 DIAGNOSIS — R0689 Other abnormalities of breathing: Secondary | ICD-10-CM | POA: Diagnosis not present

## 2017-09-21 DIAGNOSIS — G8194 Hemiplegia, unspecified affecting left nondominant side: Secondary | ICD-10-CM | POA: Diagnosis not present

## 2017-09-21 DIAGNOSIS — I69359 Hemiplegia and hemiparesis following cerebral infarction affecting unspecified side: Secondary | ICD-10-CM | POA: Diagnosis not present

## 2017-09-21 DIAGNOSIS — M542 Cervicalgia: Secondary | ICD-10-CM | POA: Diagnosis not present

## 2017-09-29 DIAGNOSIS — M12812 Other specific arthropathies, not elsewhere classified, left shoulder: Secondary | ICD-10-CM | POA: Diagnosis not present

## 2017-10-17 DIAGNOSIS — I69359 Hemiplegia and hemiparesis following cerebral infarction affecting unspecified side: Secondary | ICD-10-CM | POA: Diagnosis not present

## 2017-10-17 DIAGNOSIS — E871 Hypo-osmolality and hyponatremia: Secondary | ICD-10-CM | POA: Diagnosis not present

## 2017-10-17 DIAGNOSIS — I10 Essential (primary) hypertension: Secondary | ICD-10-CM | POA: Diagnosis not present

## 2017-10-17 DIAGNOSIS — G8194 Hemiplegia, unspecified affecting left nondominant side: Secondary | ICD-10-CM | POA: Diagnosis not present

## 2017-10-17 DIAGNOSIS — I129 Hypertensive chronic kidney disease with stage 1 through stage 4 chronic kidney disease, or unspecified chronic kidney disease: Secondary | ICD-10-CM | POA: Diagnosis not present

## 2017-10-19 DIAGNOSIS — M6281 Muscle weakness (generalized): Secondary | ICD-10-CM | POA: Diagnosis not present

## 2017-10-19 DIAGNOSIS — R2689 Other abnormalities of gait and mobility: Secondary | ICD-10-CM | POA: Diagnosis not present

## 2017-10-19 DIAGNOSIS — M25561 Pain in right knee: Secondary | ICD-10-CM | POA: Diagnosis not present

## 2017-10-19 DIAGNOSIS — R278 Other lack of coordination: Secondary | ICD-10-CM | POA: Diagnosis not present

## 2017-10-22 DIAGNOSIS — M542 Cervicalgia: Secondary | ICD-10-CM | POA: Diagnosis not present

## 2017-10-22 DIAGNOSIS — I69359 Hemiplegia and hemiparesis following cerebral infarction affecting unspecified side: Secondary | ICD-10-CM | POA: Diagnosis not present

## 2017-10-22 DIAGNOSIS — I252 Old myocardial infarction: Secondary | ICD-10-CM | POA: Diagnosis not present

## 2017-10-22 DIAGNOSIS — S92811D Other fracture of right foot, subsequent encounter for fracture with routine healing: Secondary | ICD-10-CM | POA: Diagnosis not present

## 2017-10-22 DIAGNOSIS — G8194 Hemiplegia, unspecified affecting left nondominant side: Secondary | ICD-10-CM | POA: Diagnosis not present

## 2017-10-22 DIAGNOSIS — R0689 Other abnormalities of breathing: Secondary | ICD-10-CM | POA: Diagnosis not present

## 2017-10-31 DIAGNOSIS — E119 Type 2 diabetes mellitus without complications: Secondary | ICD-10-CM | POA: Diagnosis not present

## 2017-10-31 DIAGNOSIS — I252 Old myocardial infarction: Secondary | ICD-10-CM | POA: Diagnosis not present

## 2017-10-31 DIAGNOSIS — I10 Essential (primary) hypertension: Secondary | ICD-10-CM | POA: Diagnosis not present

## 2017-10-31 DIAGNOSIS — Z8673 Personal history of transient ischemic attack (TIA), and cerebral infarction without residual deficits: Secondary | ICD-10-CM | POA: Diagnosis not present

## 2017-11-22 DIAGNOSIS — M542 Cervicalgia: Secondary | ICD-10-CM | POA: Diagnosis not present

## 2017-11-22 DIAGNOSIS — I69359 Hemiplegia and hemiparesis following cerebral infarction affecting unspecified side: Secondary | ICD-10-CM | POA: Diagnosis not present

## 2017-11-22 DIAGNOSIS — S92811D Other fracture of right foot, subsequent encounter for fracture with routine healing: Secondary | ICD-10-CM | POA: Diagnosis not present

## 2017-11-22 DIAGNOSIS — R0689 Other abnormalities of breathing: Secondary | ICD-10-CM | POA: Diagnosis not present

## 2017-11-22 DIAGNOSIS — G8194 Hemiplegia, unspecified affecting left nondominant side: Secondary | ICD-10-CM | POA: Diagnosis not present

## 2017-11-22 DIAGNOSIS — I252 Old myocardial infarction: Secondary | ICD-10-CM | POA: Diagnosis not present

## 2017-11-23 DIAGNOSIS — R350 Frequency of micturition: Secondary | ICD-10-CM | POA: Diagnosis not present

## 2017-12-20 DIAGNOSIS — S92811D Other fracture of right foot, subsequent encounter for fracture with routine healing: Secondary | ICD-10-CM | POA: Diagnosis not present

## 2017-12-20 DIAGNOSIS — M542 Cervicalgia: Secondary | ICD-10-CM | POA: Diagnosis not present

## 2017-12-20 DIAGNOSIS — I252 Old myocardial infarction: Secondary | ICD-10-CM | POA: Diagnosis not present

## 2017-12-20 DIAGNOSIS — I69359 Hemiplegia and hemiparesis following cerebral infarction affecting unspecified side: Secondary | ICD-10-CM | POA: Diagnosis not present

## 2017-12-20 DIAGNOSIS — R0689 Other abnormalities of breathing: Secondary | ICD-10-CM | POA: Diagnosis not present

## 2017-12-20 DIAGNOSIS — G8194 Hemiplegia, unspecified affecting left nondominant side: Secondary | ICD-10-CM | POA: Diagnosis not present

## 2017-12-29 DIAGNOSIS — M12811 Other specific arthropathies, not elsewhere classified, right shoulder: Secondary | ICD-10-CM | POA: Diagnosis not present

## 2017-12-29 DIAGNOSIS — M75101 Unspecified rotator cuff tear or rupture of right shoulder, not specified as traumatic: Secondary | ICD-10-CM | POA: Diagnosis not present

## 2017-12-29 DIAGNOSIS — M12812 Other specific arthropathies, not elsewhere classified, left shoulder: Secondary | ICD-10-CM | POA: Diagnosis not present

## 2018-01-12 DIAGNOSIS — Z961 Presence of intraocular lens: Secondary | ICD-10-CM | POA: Diagnosis not present

## 2018-01-12 DIAGNOSIS — H35373 Puckering of macula, bilateral: Secondary | ICD-10-CM | POA: Diagnosis not present

## 2018-01-12 DIAGNOSIS — H40013 Open angle with borderline findings, low risk, bilateral: Secondary | ICD-10-CM | POA: Diagnosis not present

## 2018-01-12 DIAGNOSIS — H26492 Other secondary cataract, left eye: Secondary | ICD-10-CM | POA: Diagnosis not present

## 2018-01-16 DIAGNOSIS — I129 Hypertensive chronic kidney disease with stage 1 through stage 4 chronic kidney disease, or unspecified chronic kidney disease: Secondary | ICD-10-CM | POA: Diagnosis not present

## 2018-01-16 DIAGNOSIS — G8194 Hemiplegia, unspecified affecting left nondominant side: Secondary | ICD-10-CM | POA: Diagnosis not present

## 2018-01-16 DIAGNOSIS — E119 Type 2 diabetes mellitus without complications: Secondary | ICD-10-CM | POA: Diagnosis not present

## 2018-01-16 DIAGNOSIS — E871 Hypo-osmolality and hyponatremia: Secondary | ICD-10-CM | POA: Diagnosis not present

## 2018-01-17 DIAGNOSIS — I10 Essential (primary) hypertension: Secondary | ICD-10-CM | POA: Diagnosis not present

## 2018-01-20 DIAGNOSIS — G8194 Hemiplegia, unspecified affecting left nondominant side: Secondary | ICD-10-CM | POA: Diagnosis not present

## 2018-01-20 DIAGNOSIS — S92811D Other fracture of right foot, subsequent encounter for fracture with routine healing: Secondary | ICD-10-CM | POA: Diagnosis not present

## 2018-01-20 DIAGNOSIS — I69359 Hemiplegia and hemiparesis following cerebral infarction affecting unspecified side: Secondary | ICD-10-CM | POA: Diagnosis not present

## 2018-01-20 DIAGNOSIS — M542 Cervicalgia: Secondary | ICD-10-CM | POA: Diagnosis not present

## 2018-01-20 DIAGNOSIS — R0689 Other abnormalities of breathing: Secondary | ICD-10-CM | POA: Diagnosis not present

## 2018-01-20 DIAGNOSIS — I252 Old myocardial infarction: Secondary | ICD-10-CM | POA: Diagnosis not present

## 2018-02-24 DIAGNOSIS — R109 Unspecified abdominal pain: Secondary | ICD-10-CM | POA: Diagnosis not present

## 2018-03-30 DIAGNOSIS — R109 Unspecified abdominal pain: Secondary | ICD-10-CM | POA: Diagnosis not present

## 2018-03-30 DIAGNOSIS — R350 Frequency of micturition: Secondary | ICD-10-CM | POA: Diagnosis not present

## 2018-04-06 DIAGNOSIS — M12812 Other specific arthropathies, not elsewhere classified, left shoulder: Secondary | ICD-10-CM | POA: Diagnosis not present

## 2018-04-14 DIAGNOSIS — R109 Unspecified abdominal pain: Secondary | ICD-10-CM | POA: Diagnosis not present

## 2018-04-14 DIAGNOSIS — R350 Frequency of micturition: Secondary | ICD-10-CM | POA: Diagnosis not present

## 2018-04-18 DIAGNOSIS — G8194 Hemiplegia, unspecified affecting left nondominant side: Secondary | ICD-10-CM | POA: Diagnosis not present

## 2018-04-18 DIAGNOSIS — E871 Hypo-osmolality and hyponatremia: Secondary | ICD-10-CM | POA: Diagnosis not present

## 2018-04-18 DIAGNOSIS — I10 Essential (primary) hypertension: Secondary | ICD-10-CM | POA: Diagnosis not present

## 2018-05-25 DIAGNOSIS — N390 Urinary tract infection, site not specified: Secondary | ICD-10-CM | POA: Diagnosis not present

## 2018-06-23 ENCOUNTER — Emergency Department (HOSPITAL_BASED_OUTPATIENT_CLINIC_OR_DEPARTMENT_OTHER): Payer: PPO

## 2018-06-23 ENCOUNTER — Emergency Department (HOSPITAL_BASED_OUTPATIENT_CLINIC_OR_DEPARTMENT_OTHER)
Admission: EM | Admit: 2018-06-23 | Discharge: 2018-06-23 | Disposition: A | Discharge status: Home / Self Care | Payer: PPO | Attending: Emergency Medicine | Admitting: Emergency Medicine

## 2018-06-23 ENCOUNTER — Other Ambulatory Visit: Payer: Self-pay

## 2018-06-23 ENCOUNTER — Encounter (HOSPITAL_BASED_OUTPATIENT_CLINIC_OR_DEPARTMENT_OTHER): Payer: Self-pay | Admitting: Emergency Medicine

## 2018-06-23 DIAGNOSIS — I10 Essential (primary) hypertension: Secondary | ICD-10-CM | POA: Insufficient documentation

## 2018-06-23 DIAGNOSIS — M546 Pain in thoracic spine: Secondary | ICD-10-CM | POA: Diagnosis not present

## 2018-06-23 DIAGNOSIS — X500XXA Overexertion from strenuous movement or load, initial encounter: Secondary | ICD-10-CM | POA: Diagnosis not present

## 2018-06-23 DIAGNOSIS — Z8673 Personal history of transient ischemic attack (TIA), and cerebral infarction without residual deficits: Secondary | ICD-10-CM | POA: Diagnosis not present

## 2018-06-23 DIAGNOSIS — Y92002 Bathroom of unspecified non-institutional (private) residence single-family (private) house as the place of occurrence of the external cause: Secondary | ICD-10-CM | POA: Insufficient documentation

## 2018-06-23 DIAGNOSIS — R0602 Shortness of breath: Secondary | ICD-10-CM | POA: Diagnosis not present

## 2018-06-23 DIAGNOSIS — T148XXA Other injury of unspecified body region, initial encounter: Secondary | ICD-10-CM | POA: Insufficient documentation

## 2018-06-23 DIAGNOSIS — Z79899 Other long term (current) drug therapy: Secondary | ICD-10-CM | POA: Insufficient documentation

## 2018-06-23 DIAGNOSIS — Y999 Unspecified external cause status: Secondary | ICD-10-CM | POA: Insufficient documentation

## 2018-06-23 DIAGNOSIS — S22089A Unspecified fracture of T11-T12 vertebra, initial encounter for closed fracture: Secondary | ICD-10-CM | POA: Insufficient documentation

## 2018-06-23 DIAGNOSIS — Z7982 Long term (current) use of aspirin: Secondary | ICD-10-CM | POA: Diagnosis not present

## 2018-06-23 DIAGNOSIS — Z743 Need for continuous supervision: Secondary | ICD-10-CM | POA: Diagnosis not present

## 2018-06-23 DIAGNOSIS — Y93E8 Activity, other personal hygiene: Secondary | ICD-10-CM | POA: Diagnosis not present

## 2018-06-23 DIAGNOSIS — R279 Unspecified lack of coordination: Secondary | ICD-10-CM | POA: Diagnosis not present

## 2018-06-23 DIAGNOSIS — S22080A Wedge compression fracture of T11-T12 vertebra, initial encounter for closed fracture: Secondary | ICD-10-CM | POA: Diagnosis not present

## 2018-06-23 DIAGNOSIS — R0902 Hypoxemia: Secondary | ICD-10-CM | POA: Diagnosis not present

## 2018-06-23 DIAGNOSIS — S29012A Strain of muscle and tendon of back wall of thorax, initial encounter: Secondary | ICD-10-CM | POA: Diagnosis not present

## 2018-06-23 DIAGNOSIS — R079 Chest pain, unspecified: Secondary | ICD-10-CM | POA: Diagnosis not present

## 2018-06-23 LAB — CBC WITH DIFFERENTIAL/PLATELET
BASOS ABS: 0 10*3/uL (ref 0.0–0.1)
Basophils Relative: 0 %
Eosinophils Absolute: 0 10*3/uL (ref 0.0–0.7)
Eosinophils Relative: 0 %
HEMATOCRIT: 37.2 % (ref 36.0–46.0)
Hemoglobin: 12.8 g/dL (ref 12.0–15.0)
Lymphocytes Relative: 28 %
Lymphs Abs: 2.4 10*3/uL (ref 0.7–4.0)
MCH: 29.6 pg (ref 26.0–34.0)
MCHC: 34.4 g/dL (ref 30.0–36.0)
MCV: 85.9 fL (ref 78.0–100.0)
MONO ABS: 0.8 10*3/uL (ref 0.1–1.0)
Monocytes Relative: 9 %
NEUTROS ABS: 5.3 10*3/uL (ref 1.7–7.7)
Neutrophils Relative %: 63 %
Platelets: 133 10*3/uL — ABNORMAL LOW (ref 150–400)
RBC: 4.33 MIL/uL (ref 3.87–5.11)
RDW: 12.8 % (ref 11.5–15.5)
WBC: 8.5 10*3/uL (ref 4.0–10.5)

## 2018-06-23 LAB — COMPREHENSIVE METABOLIC PANEL
ALT: 22 U/L (ref 0–44)
AST: 31 U/L (ref 15–41)
Albumin: 4 g/dL (ref 3.5–5.0)
Alkaline Phosphatase: 63 U/L (ref 38–126)
Anion gap: 12 (ref 5–15)
BILIRUBIN TOTAL: 1 mg/dL (ref 0.3–1.2)
BUN: 14 mg/dL (ref 8–23)
CO2: 21 mmol/L — ABNORMAL LOW (ref 22–32)
Calcium: 9 mg/dL (ref 8.9–10.3)
Chloride: 102 mmol/L (ref 98–111)
Creatinine, Ser: 0.96 mg/dL (ref 0.44–1.00)
GFR calc Af Amer: 60 mL/min — ABNORMAL LOW (ref >60)
GFR calc non Af Amer: 52 mL/min — ABNORMAL LOW (ref >60)
Glucose, Bld: 108 mg/dL — ABNORMAL HIGH (ref 70–99)
POTASSIUM: 3.5 mmol/L (ref 3.5–5.1)
Sodium: 135 mmol/L (ref 135–145)
Total Protein: 6.9 g/dL (ref 6.5–8.1)

## 2018-06-23 LAB — TROPONIN I
TROPONIN I: 0.03 ng/mL — AB (ref <0.03)
Troponin I: 0.03 ng/mL (ref <0.03)

## 2018-06-23 MED ORDER — LIDOCAINE 5 % EX PTCH: 1.0000 | MEDICATED_PATCH | CUTANEOUS | 0 refills | Status: AC

## 2018-06-23 MED ORDER — CYCLOBENZAPRINE HCL 5 MG PO TABS
5.0000 mg | ORAL_TABLET | Freq: Once | ORAL | Status: AC
  Administered 2018-06-23: 5 mg via ORAL
  Filled 2018-06-23: qty 1

## 2018-06-23 MED ORDER — HYDRALAZINE HCL 25 MG PO TABS
25.0000 mg | ORAL_TABLET | Freq: Once | ORAL | Status: AC
  Administered 2018-06-23: 25 mg via ORAL
  Filled 2018-06-23: qty 1

## 2018-06-23 MED ORDER — ACETAMINOPHEN 325 MG PO TABS
650.0000 mg | ORAL_TABLET | Freq: Once | ORAL | Status: AC
  Administered 2018-06-23: 650 mg via ORAL
  Filled 2018-06-23: qty 2

## 2018-06-23 MED ORDER — CYCLOBENZAPRINE HCL 5 MG PO TABS: 5.0000 mg | ORAL_TABLET | Freq: Three times a day (TID) | ORAL | 0 refills | Status: AC | PRN

## 2018-06-23 NOTE — ED Provider Notes (Signed)
[facility] EMERGENCY DEPARTMENT Provider Note   CSN: [id number] Arrival date & time: 06/23/18  1107     History   Chief Complaint Chief Complaint  Patient presents with  . Back Pain    HPI Elizabeth Valdez is a 84 y.o. female.  HPI   Yesterday getting up to use the commode, pulling iwht arms and felt a pop, sudden sharp pain by left scapula, had some shortness of breath which has improved today but is still present.  Pain today with sitting up straight and deep breaths.  Worse with palpation and certain movements.  No falls, no fevers, n ocough.  Had nausea and vomiting last night, this morning had some nausea but improved. Last night felt lightheaded but improved now.  Pain severe. Thinks she has hx of blood clots in legs but does not think she is on blood thinners. Hx of stroke, immobilization. Daughter reports IVC filter in place, that pt has hx of clots but none since this placement.  Past Medical History:  Diagnosis Date  . Hypertension   . Stroke ([place])   . Ulcerative colitis ([place])     There are no active problems to display for this patient.   Past Surgical History:  Procedure Laterality Date  . MASTECTOMY    . TONSILLECTOMY       OB History   None      Home Medications    Prior to Admission medications   Medication Sig Start Date End Date Taking? Authorizing Provider  albuterol (PROVENTIL) (2.5 MG/3ML) 0.083% nebulizer solution Take 2.5 mg by nebulization every 6 (six) hours as needed for wheezing or shortness of breath.    [provider]  aspirin 81 MG tablet Take 81 mg by mouth daily.    [provider]  cetirizine (ZYRTEC) 10 MG tablet Take 10 mg by mouth daily.    [provider]  clotrimazole (LOTRIMIN) 1 % cream Apply 1 application topically 2 (two) times daily.    [provider]  cyclobenzaprine (FLEXERIL) 5 MG tablet Take 1 tablet (5 mg total) by mouth 3 (three) times daily as needed for muscle spasms.  06/23/18   Gareth Morgan, MD  furosemide (LASIX) 40 MG tablet Take 40 mg by mouth daily.    [provider]  hydrALAZINE (APRESOLINE) 25 MG tablet Take 25 mg by mouth 2 (two) times daily.    [provider]  hydrochlorothiazide (MICROZIDE) 12.5 MG capsule Take 12.5 mg by mouth daily.    [provider]  irbesartan (AVAPRO) 300 MG tablet Take 300 mg by mouth daily.    [provider]  lidocaine (LIDODERM) 5 % Place 1 patch onto the skin daily. Remove & Discard patch within 12 hours or as directed by MD 06/23/18   Gareth Morgan, MD  metoprolol succinate (TOPROL-XL) 100 MG 24 hr tablet Take 150 mg by mouth daily. Take with or immediately following a meal.    [provider]  naproxen sodium (ANAPROX) 220 MG tablet Take 220 mg by mouth 2 (two) times daily with a meal.    [provider]  omeprazole (PRILOSEC) 20 MG capsule Take 20 mg by mouth daily.    [provider]  potassium chloride SA (K-DUR,KLOR-CON) 20 MEQ tablet Take 20 mEq by mouth 2 (two) times daily.    [provider]  sertraline (ZOLOFT) 50 MG tablet Take 50 mg by mouth daily.    [provider]  simvastatin (ZOCOR) 20 MG tablet  Take 20 mg by mouth daily.    [provider]  traMADol (ULTRAM) 50 MG tablet Take 50 mg by mouth every 6 (six) hours as needed.    [provider]  travoprost, benzalkonium, (TRAVATAN) 0.004 % ophthalmic solution Place 1 drop into both eyes at bedtime.    [provider]    Family History No family history on file.  Social History Social History   Tobacco Use  . Smoking status: Never Smoker  . Smokeless tobacco: Never Used  Substance Use Topics  . Alcohol use: No  . Drug use: No     Allergies   Codeine; Sulfa antibiotics; and Talwin [pentazocine]   Review of Systems Review of Systems  Constitutional: Negative for fever.  HENT: Negative for sore throat.   Eyes: Negative for visual  disturbance.  Respiratory: Positive for shortness of breath. Negative for cough.   Cardiovascular: Negative for chest pain.  Gastrointestinal: Positive for nausea and vomiting. Negative for abdominal pain.  Genitourinary: Negative for difficulty urinating.  Musculoskeletal: Positive for arthralgias and back pain. Negative for neck pain.  Skin: Negative for rash.  Neurological: Negative for syncope and headaches.     Physical Exam Updated Vital Signs BP (!) 178/83   Pulse 67   Temp 98.2 F (36.8 C) (Oral)   Resp 20   Ht 5\' 1"  (1.549 m)   Wt 61.7 kg   SpO2 94%   BMI 25.70 kg/m   Physical Exam  Constitutional: She is oriented to person, place, and time. She appears well-developed and well-nourished. No distress.  HENT:  Head: Normocephalic and atraumatic.  Eyes: Conjunctivae and EOM are normal.  Neck: Normal range of motion.  Cardiovascular: Normal rate, regular rhythm, normal heart sounds and intact distal pulses. Exam reveals no gallop and no friction rub.  No murmur heard. Pulmonary/Chest: Effort normal and breath sounds normal. No respiratory distress. She has no wheezes. She has no rales.  Tenderness to back around the scapula   Abdominal: Soft. She exhibits no distension. There is no tenderness. There is no guarding.  Musculoskeletal: She exhibits edema (2+bilateral). She exhibits no tenderness.  Neurological: She is alert and oriented to person, place, and time.  Spastic paralysis left side  Skin: Skin is warm and dry. No rash noted. She is not diaphoretic. No erythema.  Nursing note and vitals reviewed.    ED Treatments / Results  Labs (all labs ordered are listed, but only abnormal results are displayed) Labs Reviewed  CBC WITH DIFFERENTIAL/PLATELET - Abnormal; Notable for the following components:      Result Value   Platelets 133 (*)    All other components within normal limits  COMPREHENSIVE METABOLIC PANEL - Abnormal; Notable for the following components:    CO2 21 (*)    Glucose, Bld 108 (*)    GFR calc non Af Amer 52 (*)    GFR calc Af Amer 60 (*)    All other components within normal limits  TROPONIN I - Abnormal; Notable for the following components:   Troponin I 0.03 (*)    All other components within normal limits  TROPONIN I - Abnormal; Notable for the following components:   Troponin I 0.03 (*)    All other components within normal limits    EKG EKG Interpretation  Date/Time:  Friday June 23 2018 11:51:26 EDT Ventricular Rate:  74 PR Interval:    QRS Duration: 123 QT Interval:  457 QTC Calculation: 508 R Axis:   -38  Text Interpretation:  Sinus rhythm Left bundle branch block No significant change since last tracing Confirmed by Gareth Morgan [phone]) on 06/23/2018 1:13:20 PM Also confirmed by Gareth Morgan [phone]), editor Philomena Doheny [phone])  on 06/23/2018 4:40:21 PM   Radiology Dg Chest 2 View  Result Date: 06/23/2018 CLINICAL DATA:  Chest pain EXAM: CHEST - 2 VIEW COMPARISON:  January 02, 2017 FINDINGS: There is no edema or consolidation. Heart is upper normal in size with pulmonary vascularity normal. No adenopathy. No pneumothorax. No bone lesions evident. There is mild levoscoliosis in the thoracic region. IMPRESSION: No edema or consolidation. Electronically Signed   By: Lowella Grip III M.D.   On: 06/23/2018 12:25   Dg Thoracic Spine 2 View  Result Date: 06/23/2018 CLINICAL DATA:  Leaned over last pm in bed started having mid back pain on left side, hx stroke with left side deficit EXAM: THORACIC SPINE 2 VIEWS COMPARISON:  CT abdomen pelvis, 04/14/2016. FINDINGS: Moderate compression fracture of T11, new since the prior CT, but of unclear chronicity. No other fractures. No spondylolisthesis. Discs are relatively well maintained in height with no significant degenerative change. Bones are diffusely demineralized. There is a prominent dextroscoliosis, apex in the upper lumbar spine. Soft tissues are unremarkable.  IMPRESSION: 1. Moderate compression fracture of T11 new since the CT dated 04/14/2016, but of unclear chronicity. It may be recent. 2. No other fractures. Electronically Signed   By: Lajean Manes M.D.   On: 06/23/2018 14:17    Procedures Procedures (including critical care time)  Medications Ordered in ED Medications  cyclobenzaprine (FLEXERIL) tablet 5 mg (5 mg Oral Given 06/23/18 1206)  hydrALAZINE (APRESOLINE) tablet 25 mg (25 mg Oral Given 06/23/18 1616)  acetaminophen (TYLENOL) tablet 650 mg (650 mg Oral Given 06/23/18 1616)     Initial Impression / Assessment and Plan / ED Course  I have reviewed the triage vital signs and the nursing notes.  Pertinent labs & imaging results that were available during my care of the patient were reviewed by me and considered in my medical decision making (see chart for details).     84 year old female with a history of CVA, hypertension, self-reported history of DVT presents with concern for left pleuritic back pain.  Initial history concerning for likely musculoskeletal etiology the patient reporting it starting suddenly as she was pulling to a standing position, however patient also reports shortness of breath, nausea, vomiting and lightheadedness, and differential diagnosis given her history of immobility includes pulmonary embolus, ACS, or other.  Labs obtained showing no significant findings.  Troponin .03.  On reevaluation, daughter is in the room. Patient reports she no longer has pain, and denies shortness of breath, nausea or other symptoms.  After discussing her symptoms again with patient and daughter, we agree they are most consistent with musculoskeletal pain given worsening with movement, palpation, and now resolution with flexeril.  Troponin borderline, however checked 3 hours later and stable, and given presentation now doubt ACS. Possible borderline value given age and hypertension. Given home hypertension medication. Discussed possibility of  more aggressive monitoring, CT PE study, admission for continued care, however patient and daughter decline. Agree that most likely etiology of pain today is muscular strain and that other symptoms which were transient may have been secondary to pain.  XR shows T11 fracture, however this is lower than where pt having pain, and unclear if acute.    Given rx for flexeril, lidocaine patch. Discussed reasons to return in detail. Patient discharged in  stable condition with understanding of reasons to return.    Final Clinical Impressions(s) / ED Diagnoses   Final diagnoses:  Acute left-sided thoracic back pain  Muscle strain  Closed fracture of eleventh thoracic vertebra, unspecified fracture morphology, initial encounter [facility])    ED Discharge Orders         Ordered    cyclobenzaprine (FLEXERIL) 5 MG tablet  3 times daily PRN     06/23/18 1602    lidocaine (LIDODERM) 5 %  Every 24 hours     06/23/18 1602           Gareth Morgan, MD 06/23/18 1725

## 2018-06-23 NOTE — ED Triage Notes (Signed)
Left scapula and left side pain reported by patient and caregiver. Pt states that when she stood up from her wheelchair in the bathroom she felt a pop and she gasped and sat back down quickly.

## 2018-06-23 NOTE — ED Notes (Signed)
Pt states that she felt a "pop" in her left shoulder when she pulled herself up from her wheelchair to transfer to toilet. She immediately sat back down. She states that she feels like the pain is radiating to her left upper side just under her armpit. Pts caregiver states that she took 650mg  of tylenol for pain today at 0930.

## 2018-06-23 NOTE — ED Notes (Signed)
Patient transported to X-ray 

## 2018-06-23 NOTE — ED Notes (Signed)
Pt cleaned and adult brief changed with of assistance of RN jennaya.

## 2018-06-23 NOTE — ED Notes (Signed)
ED Provider at bedside. 

## 2018-06-23 NOTE — ED Notes (Signed)
Report given to RN at [facility]

## 2018-06-30 DIAGNOSIS — I10 Essential (primary) hypertension: Secondary | ICD-10-CM | POA: Diagnosis not present

## 2018-06-30 DIAGNOSIS — M549 Dorsalgia, unspecified: Secondary | ICD-10-CM | POA: Diagnosis not present

## 2018-07-10 DIAGNOSIS — M12812 Other specific arthropathies, not elsewhere classified, left shoulder: Secondary | ICD-10-CM | POA: Diagnosis not present

## 2018-07-19 DIAGNOSIS — H52223 Regular astigmatism, bilateral: Secondary | ICD-10-CM | POA: Diagnosis not present

## 2018-07-19 DIAGNOSIS — Z961 Presence of intraocular lens: Secondary | ICD-10-CM | POA: Diagnosis not present

## 2018-07-19 DIAGNOSIS — H4010X4 Unspecified open-angle glaucoma, indeterminate stage: Secondary | ICD-10-CM | POA: Diagnosis not present

## 2018-07-19 DIAGNOSIS — H5213 Myopia, bilateral: Secondary | ICD-10-CM | POA: Diagnosis not present

## 2018-07-22 DIAGNOSIS — R319 Hematuria, unspecified: Secondary | ICD-10-CM | POA: Diagnosis not present

## 2018-07-22 DIAGNOSIS — R32 Unspecified urinary incontinence: Secondary | ICD-10-CM | POA: Diagnosis not present

## 2018-07-25 DIAGNOSIS — M6281 Muscle weakness (generalized): Secondary | ICD-10-CM | POA: Diagnosis not present

## 2018-07-25 DIAGNOSIS — R1312 Dysphagia, oropharyngeal phase: Secondary | ICD-10-CM | POA: Diagnosis not present

## 2018-07-25 DIAGNOSIS — R2689 Other abnormalities of gait and mobility: Secondary | ICD-10-CM | POA: Diagnosis not present

## 2018-07-27 DIAGNOSIS — N390 Urinary tract infection, site not specified: Secondary | ICD-10-CM | POA: Diagnosis not present

## 2018-08-03 DIAGNOSIS — G894 Chronic pain syndrome: Secondary | ICD-10-CM | POA: Diagnosis not present

## 2018-08-03 DIAGNOSIS — S22080A Wedge compression fracture of T11-T12 vertebra, initial encounter for closed fracture: Secondary | ICD-10-CM | POA: Diagnosis not present

## 2018-08-03 DIAGNOSIS — M48062 Spinal stenosis, lumbar region with neurogenic claudication: Secondary | ICD-10-CM | POA: Diagnosis not present

## 2018-08-03 DIAGNOSIS — M48061 Spinal stenosis, lumbar region without neurogenic claudication: Secondary | ICD-10-CM | POA: Diagnosis not present

## 2018-08-03 DIAGNOSIS — M545 Low back pain: Secondary | ICD-10-CM | POA: Diagnosis not present

## 2018-08-03 DIAGNOSIS — M47816 Spondylosis without myelopathy or radiculopathy, lumbar region: Secondary | ICD-10-CM | POA: Diagnosis not present

## 2018-08-03 DIAGNOSIS — M5136 Other intervertebral disc degeneration, lumbar region: Secondary | ICD-10-CM | POA: Diagnosis not present

## 2018-09-01 DIAGNOSIS — M24542 Contracture, left hand: Secondary | ICD-10-CM | POA: Diagnosis not present

## 2018-09-01 DIAGNOSIS — M6281 Muscle weakness (generalized): Secondary | ICD-10-CM | POA: Diagnosis not present

## 2018-09-01 DIAGNOSIS — I69854 Hemiplegia and hemiparesis following other cerebrovascular disease affecting left non-dominant side: Secondary | ICD-10-CM | POA: Diagnosis not present

## 2018-09-19 DIAGNOSIS — M6281 Muscle weakness (generalized): Secondary | ICD-10-CM | POA: Diagnosis not present

## 2018-09-19 DIAGNOSIS — I69854 Hemiplegia and hemiparesis following other cerebrovascular disease affecting left non-dominant side: Secondary | ICD-10-CM | POA: Diagnosis not present

## 2018-09-19 DIAGNOSIS — M24542 Contracture, left hand: Secondary | ICD-10-CM | POA: Diagnosis not present

## 2018-10-02 DIAGNOSIS — M12812 Other specific arthropathies, not elsewhere classified, left shoulder: Secondary | ICD-10-CM | POA: Diagnosis not present

## 2018-10-17 DIAGNOSIS — I129 Hypertensive chronic kidney disease with stage 1 through stage 4 chronic kidney disease, or unspecified chronic kidney disease: Secondary | ICD-10-CM | POA: Diagnosis not present

## 2018-10-17 DIAGNOSIS — I10 Essential (primary) hypertension: Secondary | ICD-10-CM | POA: Diagnosis not present

## 2018-10-17 DIAGNOSIS — N183 Chronic kidney disease, stage 3 (moderate): Secondary | ICD-10-CM | POA: Diagnosis not present

## 2018-10-17 DIAGNOSIS — I69354 Hemiplegia and hemiparesis following cerebral infarction affecting left non-dominant side: Secondary | ICD-10-CM | POA: Diagnosis not present

## 2018-10-17 DIAGNOSIS — E871 Hypo-osmolality and hyponatremia: Secondary | ICD-10-CM | POA: Diagnosis not present

## 2018-10-17 DIAGNOSIS — R32 Unspecified urinary incontinence: Secondary | ICD-10-CM | POA: Diagnosis not present

## 2018-10-17 DIAGNOSIS — Z794 Long term (current) use of insulin: Secondary | ICD-10-CM | POA: Diagnosis not present

## 2018-10-17 DIAGNOSIS — E1122 Type 2 diabetes mellitus with diabetic chronic kidney disease: Secondary | ICD-10-CM | POA: Diagnosis not present

## 2018-10-20 DIAGNOSIS — M24542 Contracture, left hand: Secondary | ICD-10-CM | POA: Diagnosis not present

## 2018-10-20 DIAGNOSIS — M6281 Muscle weakness (generalized): Secondary | ICD-10-CM | POA: Diagnosis not present

## 2018-10-20 DIAGNOSIS — I69854 Hemiplegia and hemiparesis following other cerebrovascular disease affecting left non-dominant side: Secondary | ICD-10-CM | POA: Diagnosis not present

## 2018-10-31 DIAGNOSIS — M24542 Contracture, left hand: Secondary | ICD-10-CM | POA: Diagnosis not present

## 2018-11-01 DIAGNOSIS — M24542 Contracture, left hand: Secondary | ICD-10-CM | POA: Diagnosis not present

## 2018-11-13 DIAGNOSIS — N390 Urinary tract infection, site not specified: Secondary | ICD-10-CM | POA: Diagnosis not present

## 2018-12-19 DIAGNOSIS — L821 Other seborrheic keratosis: Secondary | ICD-10-CM | POA: Diagnosis not present

## 2018-12-21 DIAGNOSIS — M12812 Other specific arthropathies, not elsewhere classified, left shoulder: Secondary | ICD-10-CM | POA: Diagnosis not present

## 2019-02-28 DIAGNOSIS — M24542 Contracture, left hand: Secondary | ICD-10-CM | POA: Diagnosis not present

## 2019-02-28 DIAGNOSIS — M6281 Muscle weakness (generalized): Secondary | ICD-10-CM | POA: Diagnosis not present

## 2019-02-28 DIAGNOSIS — I69854 Hemiplegia and hemiparesis following other cerebrovascular disease affecting left non-dominant side: Secondary | ICD-10-CM | POA: Diagnosis not present

## 2019-03-19 DIAGNOSIS — I69854 Hemiplegia and hemiparesis following other cerebrovascular disease affecting left non-dominant side: Secondary | ICD-10-CM | POA: Diagnosis not present

## 2019-03-19 DIAGNOSIS — M24542 Contracture, left hand: Secondary | ICD-10-CM | POA: Diagnosis not present

## 2019-03-19 DIAGNOSIS — M6281 Muscle weakness (generalized): Secondary | ICD-10-CM | POA: Diagnosis not present

## 2019-04-18 DIAGNOSIS — Z794 Long term (current) use of insulin: Secondary | ICD-10-CM | POA: Diagnosis not present

## 2019-04-18 DIAGNOSIS — K52831 Collagenous colitis: Secondary | ICD-10-CM | POA: Diagnosis not present

## 2019-04-18 DIAGNOSIS — E119 Type 2 diabetes mellitus without complications: Secondary | ICD-10-CM | POA: Diagnosis not present

## 2019-04-18 DIAGNOSIS — N183 Chronic kidney disease, stage 3 (moderate): Secondary | ICD-10-CM | POA: Diagnosis not present

## 2019-04-18 DIAGNOSIS — G8194 Hemiplegia, unspecified affecting left nondominant side: Secondary | ICD-10-CM | POA: Diagnosis not present

## 2019-04-18 DIAGNOSIS — E871 Hypo-osmolality and hyponatremia: Secondary | ICD-10-CM | POA: Diagnosis not present

## 2019-04-18 DIAGNOSIS — I2510 Atherosclerotic heart disease of native coronary artery without angina pectoris: Secondary | ICD-10-CM | POA: Diagnosis not present

## 2019-04-18 DIAGNOSIS — I129 Hypertensive chronic kidney disease with stage 1 through stage 4 chronic kidney disease, or unspecified chronic kidney disease: Secondary | ICD-10-CM | POA: Diagnosis not present

## 2019-04-19 DIAGNOSIS — I10 Essential (primary) hypertension: Secondary | ICD-10-CM | POA: Diagnosis not present

## 2019-04-26 DIAGNOSIS — I10 Essential (primary) hypertension: Secondary | ICD-10-CM | POA: Diagnosis not present

## 2019-05-11 DIAGNOSIS — I10 Essential (primary) hypertension: Secondary | ICD-10-CM | POA: Diagnosis not present

## 2019-05-16 DIAGNOSIS — B342 Coronavirus infection, unspecified: Secondary | ICD-10-CM | POA: Diagnosis not present

## 2019-08-06 DIAGNOSIS — M79671 Pain in right foot: Secondary | ICD-10-CM | POA: Diagnosis not present

## 2019-08-06 DIAGNOSIS — R609 Edema, unspecified: Secondary | ICD-10-CM | POA: Diagnosis not present

## 2019-08-06 DIAGNOSIS — M79661 Pain in right lower leg: Secondary | ICD-10-CM | POA: Diagnosis not present

## 2019-08-06 DIAGNOSIS — W19XXXA Unspecified fall, initial encounter: Secondary | ICD-10-CM | POA: Diagnosis not present

## 2019-08-06 DIAGNOSIS — M25571 Pain in right ankle and joints of right foot: Secondary | ICD-10-CM | POA: Diagnosis not present

## 2019-08-07 DIAGNOSIS — R079 Chest pain, unspecified: Secondary | ICD-10-CM | POA: Diagnosis not present

## 2019-08-07 DIAGNOSIS — R0781 Pleurodynia: Secondary | ICD-10-CM | POA: Diagnosis not present

## 2019-08-07 DIAGNOSIS — W19XXXA Unspecified fall, initial encounter: Secondary | ICD-10-CM | POA: Diagnosis not present

## 2019-09-07 DIAGNOSIS — L03115 Cellulitis of right lower limb: Secondary | ICD-10-CM | POA: Diagnosis not present

## 2019-09-07 DIAGNOSIS — M79672 Pain in left foot: Secondary | ICD-10-CM | POA: Diagnosis not present

## 2019-09-07 DIAGNOSIS — S8991XD Unspecified injury of right lower leg, subsequent encounter: Secondary | ICD-10-CM | POA: Diagnosis not present

## 2019-09-07 DIAGNOSIS — Q6689 Other  specified congenital deformities of feet: Secondary | ICD-10-CM | POA: Diagnosis not present

## 2019-09-07 DIAGNOSIS — B351 Tinea unguium: Secondary | ICD-10-CM | POA: Diagnosis not present

## 2019-09-07 DIAGNOSIS — M79671 Pain in right foot: Secondary | ICD-10-CM | POA: Diagnosis not present

## 2019-09-08 DIAGNOSIS — R609 Edema, unspecified: Secondary | ICD-10-CM | POA: Diagnosis not present

## 2019-09-08 DIAGNOSIS — M79604 Pain in right leg: Secondary | ICD-10-CM | POA: Diagnosis not present

## 2019-10-01 DIAGNOSIS — I10 Essential (primary) hypertension: Secondary | ICD-10-CM | POA: Diagnosis not present

## 2019-10-08 DIAGNOSIS — N183 Chronic kidney disease, stage 3 unspecified: Secondary | ICD-10-CM | POA: Diagnosis not present

## 2019-10-08 DIAGNOSIS — I10 Essential (primary) hypertension: Secondary | ICD-10-CM | POA: Diagnosis not present

## 2019-10-08 DIAGNOSIS — M12812 Other specific arthropathies, not elsewhere classified, left shoulder: Secondary | ICD-10-CM | POA: Diagnosis not present

## 2019-10-08 DIAGNOSIS — G8194 Hemiplegia, unspecified affecting left nondominant side: Secondary | ICD-10-CM | POA: Diagnosis not present

## 2019-10-08 DIAGNOSIS — E871 Hypo-osmolality and hyponatremia: Secondary | ICD-10-CM | POA: Diagnosis not present

## 2019-10-08 DIAGNOSIS — I129 Hypertensive chronic kidney disease with stage 1 through stage 4 chronic kidney disease, or unspecified chronic kidney disease: Secondary | ICD-10-CM | POA: Diagnosis not present

## 2019-10-08 DIAGNOSIS — C50919 Malignant neoplasm of unspecified site of unspecified female breast: Secondary | ICD-10-CM | POA: Diagnosis not present

## 2019-10-23 DIAGNOSIS — M6281 Muscle weakness (generalized): Secondary | ICD-10-CM | POA: Diagnosis not present

## 2019-10-23 DIAGNOSIS — M24571 Contracture, right ankle: Secondary | ICD-10-CM | POA: Diagnosis not present

## 2019-10-23 DIAGNOSIS — R2681 Unsteadiness on feet: Secondary | ICD-10-CM | POA: Diagnosis not present

## 2019-10-23 DIAGNOSIS — M24572 Contracture, left ankle: Secondary | ICD-10-CM | POA: Diagnosis not present

## 2019-10-23 DIAGNOSIS — I69854 Hemiplegia and hemiparesis following other cerebrovascular disease affecting left non-dominant side: Secondary | ICD-10-CM | POA: Diagnosis not present

## 2019-10-23 DIAGNOSIS — Z9181 History of falling: Secondary | ICD-10-CM | POA: Diagnosis not present

## 2019-11-19 DIAGNOSIS — M24542 Contracture, left hand: Secondary | ICD-10-CM | POA: Diagnosis not present

## 2019-11-19 DIAGNOSIS — R278 Other lack of coordination: Secondary | ICD-10-CM | POA: Diagnosis not present

## 2019-11-19 DIAGNOSIS — I69854 Hemiplegia and hemiparesis following other cerebrovascular disease affecting left non-dominant side: Secondary | ICD-10-CM | POA: Diagnosis not present

## 2019-11-19 DIAGNOSIS — K529 Noninfective gastroenteritis and colitis, unspecified: Secondary | ICD-10-CM | POA: Diagnosis not present

## 2019-11-19 DIAGNOSIS — R2681 Unsteadiness on feet: Secondary | ICD-10-CM | POA: Diagnosis not present

## 2019-11-19 DIAGNOSIS — E785 Hyperlipidemia, unspecified: Secondary | ICD-10-CM | POA: Diagnosis not present

## 2019-11-19 DIAGNOSIS — M6281 Muscle weakness (generalized): Secondary | ICD-10-CM | POA: Diagnosis not present

## 2019-11-19 DIAGNOSIS — Z9181 History of falling: Secondary | ICD-10-CM | POA: Diagnosis not present

## 2019-11-19 DIAGNOSIS — M24571 Contracture, right ankle: Secondary | ICD-10-CM | POA: Diagnosis not present

## 2019-11-19 DIAGNOSIS — R1312 Dysphagia, oropharyngeal phase: Secondary | ICD-10-CM | POA: Diagnosis not present

## 2019-12-02 DIAGNOSIS — Z86718 Personal history of other venous thrombosis and embolism: Secondary | ICD-10-CM | POA: Diagnosis not present

## 2019-12-02 DIAGNOSIS — M19049 Primary osteoarthritis, unspecified hand: Secondary | ICD-10-CM | POA: Diagnosis not present

## 2019-12-02 DIAGNOSIS — I2510 Atherosclerotic heart disease of native coronary artery without angina pectoris: Secondary | ICD-10-CM | POA: Diagnosis not present

## 2019-12-02 DIAGNOSIS — H409 Unspecified glaucoma: Secondary | ICD-10-CM | POA: Diagnosis not present

## 2019-12-02 DIAGNOSIS — D696 Thrombocytopenia, unspecified: Secondary | ICD-10-CM | POA: Diagnosis not present

## 2019-12-02 DIAGNOSIS — Z7401 Bed confinement status: Secondary | ICD-10-CM | POA: Diagnosis not present

## 2019-12-02 DIAGNOSIS — Z789 Other specified health status: Secondary | ICD-10-CM | POA: Diagnosis not present

## 2019-12-02 DIAGNOSIS — Z7901 Long term (current) use of anticoagulants: Secondary | ICD-10-CM | POA: Diagnosis not present

## 2019-12-02 DIAGNOSIS — I639 Cerebral infarction, unspecified: Secondary | ICD-10-CM | POA: Diagnosis not present

## 2019-12-02 DIAGNOSIS — G8190 Hemiplegia, unspecified affecting unspecified side: Secondary | ICD-10-CM | POA: Diagnosis not present

## 2019-12-02 DIAGNOSIS — I517 Cardiomegaly: Secondary | ICD-10-CM | POA: Diagnosis not present

## 2019-12-02 DIAGNOSIS — R52 Pain, unspecified: Secondary | ICD-10-CM | POA: Diagnosis not present

## 2019-12-02 DIAGNOSIS — I129 Hypertensive chronic kidney disease with stage 1 through stage 4 chronic kidney disease, or unspecified chronic kidney disease: Secondary | ICD-10-CM | POA: Diagnosis not present

## 2019-12-02 DIAGNOSIS — Z66 Do not resuscitate: Secondary | ICD-10-CM | POA: Diagnosis not present

## 2019-12-02 DIAGNOSIS — I447 Left bundle-branch block, unspecified: Secondary | ICD-10-CM | POA: Diagnosis not present

## 2019-12-02 DIAGNOSIS — F325 Major depressive disorder, single episode, in full remission: Secondary | ICD-10-CM | POA: Diagnosis not present

## 2019-12-02 DIAGNOSIS — D62 Acute posthemorrhagic anemia: Secondary | ICD-10-CM | POA: Diagnosis not present

## 2019-12-02 DIAGNOSIS — I872 Venous insufficiency (chronic) (peripheral): Secondary | ICD-10-CM | POA: Diagnosis not present

## 2019-12-02 DIAGNOSIS — K219 Gastro-esophageal reflux disease without esophagitis: Secondary | ICD-10-CM | POA: Diagnosis not present

## 2019-12-02 DIAGNOSIS — Z7409 Other reduced mobility: Secondary | ICD-10-CM | POA: Diagnosis not present

## 2019-12-02 DIAGNOSIS — Z853 Personal history of malignant neoplasm of breast: Secondary | ICD-10-CM | POA: Diagnosis not present

## 2019-12-02 DIAGNOSIS — Z20822 Contact with and (suspected) exposure to covid-19: Secondary | ICD-10-CM | POA: Diagnosis not present

## 2019-12-02 DIAGNOSIS — J45909 Unspecified asthma, uncomplicated: Secondary | ICD-10-CM | POA: Diagnosis not present

## 2019-12-02 DIAGNOSIS — Z7982 Long term (current) use of aspirin: Secondary | ICD-10-CM | POA: Diagnosis not present

## 2019-12-02 DIAGNOSIS — K649 Unspecified hemorrhoids: Secondary | ICD-10-CM | POA: Diagnosis not present

## 2019-12-02 DIAGNOSIS — I160 Hypertensive urgency: Secondary | ICD-10-CM | POA: Diagnosis not present

## 2019-12-02 DIAGNOSIS — R58 Hemorrhage, not elsewhere classified: Secondary | ICD-10-CM | POA: Diagnosis not present

## 2019-12-02 DIAGNOSIS — E1122 Type 2 diabetes mellitus with diabetic chronic kidney disease: Secondary | ICD-10-CM | POA: Diagnosis not present

## 2019-12-02 DIAGNOSIS — D649 Anemia, unspecified: Secondary | ICD-10-CM | POA: Diagnosis not present

## 2019-12-02 DIAGNOSIS — I10 Essential (primary) hypertension: Secondary | ICD-10-CM | POA: Diagnosis not present

## 2019-12-02 DIAGNOSIS — I69354 Hemiplegia and hemiparesis following cerebral infarction affecting left non-dominant side: Secondary | ICD-10-CM | POA: Diagnosis not present

## 2019-12-02 DIAGNOSIS — I444 Left anterior fascicular block: Secondary | ICD-10-CM | POA: Diagnosis not present

## 2019-12-02 DIAGNOSIS — K625 Hemorrhage of anus and rectum: Secondary | ICD-10-CM | POA: Diagnosis not present

## 2019-12-02 DIAGNOSIS — N183 Chronic kidney disease, stage 3 unspecified: Secondary | ICD-10-CM | POA: Diagnosis not present

## 2019-12-02 DIAGNOSIS — E785 Hyperlipidemia, unspecified: Secondary | ICD-10-CM | POA: Diagnosis not present

## 2019-12-02 DIAGNOSIS — M2550 Pain in unspecified joint: Secondary | ICD-10-CM | POA: Diagnosis not present

## 2019-12-02 DIAGNOSIS — K921 Melena: Secondary | ICD-10-CM | POA: Diagnosis not present

## 2019-12-02 DIAGNOSIS — M810 Age-related osteoporosis without current pathological fracture: Secondary | ICD-10-CM | POA: Diagnosis not present

## 2019-12-02 DIAGNOSIS — K5909 Other constipation: Secondary | ICD-10-CM | POA: Diagnosis not present

## 2019-12-02 DIAGNOSIS — Z7902 Long term (current) use of antithrombotics/antiplatelets: Secondary | ICD-10-CM | POA: Diagnosis not present

## 2019-12-02 DIAGNOSIS — K5791 Diverticulosis of intestine, part unspecified, without perforation or abscess with bleeding: Secondary | ICD-10-CM | POA: Diagnosis not present

## 2019-12-02 DIAGNOSIS — E872 Acidosis: Secondary | ICD-10-CM | POA: Diagnosis not present

## 2019-12-11 DIAGNOSIS — M79672 Pain in left foot: Secondary | ICD-10-CM | POA: Diagnosis not present

## 2019-12-11 DIAGNOSIS — B351 Tinea unguium: Secondary | ICD-10-CM | POA: Diagnosis not present

## 2019-12-11 DIAGNOSIS — M79671 Pain in right foot: Secondary | ICD-10-CM | POA: Diagnosis not present

## 2019-12-18 DIAGNOSIS — R1312 Dysphagia, oropharyngeal phase: Secondary | ICD-10-CM | POA: Diagnosis not present

## 2019-12-18 DIAGNOSIS — K529 Noninfective gastroenteritis and colitis, unspecified: Secondary | ICD-10-CM | POA: Diagnosis not present

## 2019-12-18 DIAGNOSIS — I69854 Hemiplegia and hemiparesis following other cerebrovascular disease affecting left non-dominant side: Secondary | ICD-10-CM | POA: Diagnosis not present

## 2019-12-19 DIAGNOSIS — I129 Hypertensive chronic kidney disease with stage 1 through stage 4 chronic kidney disease, or unspecified chronic kidney disease: Secondary | ICD-10-CM | POA: Diagnosis not present

## 2019-12-19 DIAGNOSIS — N183 Chronic kidney disease, stage 3 unspecified: Secondary | ICD-10-CM | POA: Diagnosis not present

## 2019-12-19 DIAGNOSIS — K5730 Diverticulosis of large intestine without perforation or abscess without bleeding: Secondary | ICD-10-CM | POA: Diagnosis not present

## 2019-12-19 DIAGNOSIS — I10 Essential (primary) hypertension: Secondary | ICD-10-CM | POA: Diagnosis not present

## 2020-01-14 DIAGNOSIS — M12812 Other specific arthropathies, not elsewhere classified, left shoulder: Secondary | ICD-10-CM | POA: Diagnosis not present

## 2020-03-10 DIAGNOSIS — M79672 Pain in left foot: Secondary | ICD-10-CM | POA: Diagnosis not present

## 2020-03-10 DIAGNOSIS — M79671 Pain in right foot: Secondary | ICD-10-CM | POA: Diagnosis not present

## 2020-03-10 DIAGNOSIS — B351 Tinea unguium: Secondary | ICD-10-CM | POA: Diagnosis not present

## 2020-04-18 DIAGNOSIS — I679 Cerebrovascular disease, unspecified: Secondary | ICD-10-CM | POA: Diagnosis not present

## 2020-04-18 DIAGNOSIS — N390 Urinary tract infection, site not specified: Secondary | ICD-10-CM | POA: Diagnosis not present

## 2020-04-18 DIAGNOSIS — I10 Essential (primary) hypertension: Secondary | ICD-10-CM | POA: Diagnosis not present

## 2020-04-18 DIAGNOSIS — I69354 Hemiplegia and hemiparesis following cerebral infarction affecting left non-dominant side: Secondary | ICD-10-CM | POA: Diagnosis not present

## 2020-04-18 DIAGNOSIS — F0150 Vascular dementia without behavioral disturbance: Secondary | ICD-10-CM | POA: Diagnosis not present

## 2020-05-06 DIAGNOSIS — F0150 Vascular dementia without behavioral disturbance: Secondary | ICD-10-CM | POA: Diagnosis not present

## 2020-05-06 DIAGNOSIS — G4700 Insomnia, unspecified: Secondary | ICD-10-CM | POA: Diagnosis not present

## 2020-05-06 DIAGNOSIS — F329 Major depressive disorder, single episode, unspecified: Secondary | ICD-10-CM | POA: Diagnosis not present

## 2020-05-07 DIAGNOSIS — R1312 Dysphagia, oropharyngeal phase: Secondary | ICD-10-CM | POA: Diagnosis not present

## 2020-05-07 DIAGNOSIS — K219 Gastro-esophageal reflux disease without esophagitis: Secondary | ICD-10-CM | POA: Diagnosis not present

## 2020-05-07 DIAGNOSIS — F0150 Vascular dementia without behavioral disturbance: Secondary | ICD-10-CM | POA: Diagnosis not present

## 2020-05-07 DIAGNOSIS — I69354 Hemiplegia and hemiparesis following cerebral infarction affecting left non-dominant side: Secondary | ICD-10-CM | POA: Diagnosis not present

## 2020-05-19 DIAGNOSIS — K219 Gastro-esophageal reflux disease without esophagitis: Secondary | ICD-10-CM | POA: Diagnosis not present

## 2020-05-19 DIAGNOSIS — I69354 Hemiplegia and hemiparesis following cerebral infarction affecting left non-dominant side: Secondary | ICD-10-CM | POA: Diagnosis not present

## 2020-05-19 DIAGNOSIS — F0150 Vascular dementia without behavioral disturbance: Secondary | ICD-10-CM | POA: Diagnosis not present

## 2020-05-19 DIAGNOSIS — R1312 Dysphagia, oropharyngeal phase: Secondary | ICD-10-CM | POA: Diagnosis not present

## 2020-06-09 DIAGNOSIS — M79672 Pain in left foot: Secondary | ICD-10-CM | POA: Diagnosis not present

## 2020-06-09 DIAGNOSIS — M79671 Pain in right foot: Secondary | ICD-10-CM | POA: Diagnosis not present

## 2020-06-09 DIAGNOSIS — B351 Tinea unguium: Secondary | ICD-10-CM | POA: Diagnosis not present

## 2020-06-13 DIAGNOSIS — I82409 Acute embolism and thrombosis of unspecified deep veins of unspecified lower extremity: Secondary | ICD-10-CM | POA: Diagnosis not present

## 2020-06-13 DIAGNOSIS — F0390 Unspecified dementia without behavioral disturbance: Secondary | ICD-10-CM | POA: Diagnosis not present

## 2020-06-13 DIAGNOSIS — K219 Gastro-esophageal reflux disease without esophagitis: Secondary | ICD-10-CM | POA: Diagnosis not present

## 2020-06-13 DIAGNOSIS — I6930 Unspecified sequelae of cerebral infarction: Secondary | ICD-10-CM | POA: Diagnosis not present

## 2020-06-13 DIAGNOSIS — N390 Urinary tract infection, site not specified: Secondary | ICD-10-CM | POA: Diagnosis not present

## 2020-06-13 DIAGNOSIS — D649 Anemia, unspecified: Secondary | ICD-10-CM | POA: Diagnosis not present

## 2020-06-13 DIAGNOSIS — I10 Essential (primary) hypertension: Secondary | ICD-10-CM | POA: Diagnosis not present

## 2020-06-13 DIAGNOSIS — T7840XD Allergy, unspecified, subsequent encounter: Secondary | ICD-10-CM | POA: Diagnosis not present

## 2020-06-13 DIAGNOSIS — F329 Major depressive disorder, single episode, unspecified: Secondary | ICD-10-CM | POA: Diagnosis not present

## 2020-06-13 DIAGNOSIS — E785 Hyperlipidemia, unspecified: Secondary | ICD-10-CM | POA: Diagnosis not present

## 2020-06-13 DIAGNOSIS — G4700 Insomnia, unspecified: Secondary | ICD-10-CM | POA: Diagnosis not present

## 2020-06-13 DIAGNOSIS — R52 Pain, unspecified: Secondary | ICD-10-CM | POA: Diagnosis not present

## 2020-06-27 DIAGNOSIS — I679 Cerebrovascular disease, unspecified: Secondary | ICD-10-CM | POA: Diagnosis not present

## 2020-06-27 DIAGNOSIS — G8929 Other chronic pain: Secondary | ICD-10-CM | POA: Diagnosis not present

## 2020-06-27 DIAGNOSIS — Z87440 Personal history of urinary (tract) infections: Secondary | ICD-10-CM | POA: Diagnosis not present

## 2020-06-27 DIAGNOSIS — F339 Major depressive disorder, recurrent, unspecified: Secondary | ICD-10-CM | POA: Diagnosis not present

## 2020-06-27 DIAGNOSIS — I10 Essential (primary) hypertension: Secondary | ICD-10-CM | POA: Diagnosis not present

## 2020-06-27 DIAGNOSIS — R634 Abnormal weight loss: Secondary | ICD-10-CM | POA: Diagnosis not present

## 2020-07-01 DIAGNOSIS — G4751 Confusional arousals: Secondary | ICD-10-CM | POA: Diagnosis not present

## 2020-07-02 DIAGNOSIS — R638 Other symptoms and signs concerning food and fluid intake: Secondary | ICD-10-CM | POA: Diagnosis not present

## 2020-07-02 DIAGNOSIS — R5381 Other malaise: Secondary | ICD-10-CM | POA: Diagnosis not present

## 2020-07-02 DIAGNOSIS — R4182 Altered mental status, unspecified: Secondary | ICD-10-CM | POA: Diagnosis not present

## 2020-07-04 DIAGNOSIS — I10 Essential (primary) hypertension: Secondary | ICD-10-CM | POA: Diagnosis not present

## 2020-07-04 DIAGNOSIS — G4751 Confusional arousals: Secondary | ICD-10-CM | POA: Diagnosis not present

## 2020-07-17 DIAGNOSIS — Z7189 Other specified counseling: Secondary | ICD-10-CM | POA: Diagnosis not present

## 2020-08-04 DIAGNOSIS — H6122 Impacted cerumen, left ear: Secondary | ICD-10-CM | POA: Diagnosis not present

## 2020-08-05 DIAGNOSIS — F330 Major depressive disorder, recurrent, mild: Secondary | ICD-10-CM | POA: Diagnosis not present

## 2020-08-05 DIAGNOSIS — F0390 Unspecified dementia without behavioral disturbance: Secondary | ICD-10-CM | POA: Diagnosis not present

## 2020-08-05 DIAGNOSIS — G4700 Insomnia, unspecified: Secondary | ICD-10-CM | POA: Diagnosis not present

## 2020-08-07 DIAGNOSIS — I10 Essential (primary) hypertension: Secondary | ICD-10-CM | POA: Diagnosis not present

## 2020-08-07 DIAGNOSIS — D6489 Other specified anemias: Secondary | ICD-10-CM | POA: Diagnosis not present

## 2020-08-07 DIAGNOSIS — I6930 Unspecified sequelae of cerebral infarction: Secondary | ICD-10-CM | POA: Diagnosis not present

## 2020-08-07 DIAGNOSIS — R634 Abnormal weight loss: Secondary | ICD-10-CM | POA: Diagnosis not present

## 2020-08-07 DIAGNOSIS — G8929 Other chronic pain: Secondary | ICD-10-CM | POA: Diagnosis not present

## 2020-08-07 DIAGNOSIS — T7840XD Allergy, unspecified, subsequent encounter: Secondary | ICD-10-CM | POA: Diagnosis not present

## 2020-08-07 DIAGNOSIS — K219 Gastro-esophageal reflux disease without esophagitis: Secondary | ICD-10-CM | POA: Diagnosis not present

## 2020-08-07 DIAGNOSIS — N390 Urinary tract infection, site not specified: Secondary | ICD-10-CM | POA: Diagnosis not present

## 2020-08-07 DIAGNOSIS — F0150 Vascular dementia without behavioral disturbance: Secondary | ICD-10-CM | POA: Diagnosis not present

## 2020-08-07 DIAGNOSIS — G4700 Insomnia, unspecified: Secondary | ICD-10-CM | POA: Diagnosis not present

## 2020-08-07 DIAGNOSIS — E785 Hyperlipidemia, unspecified: Secondary | ICD-10-CM | POA: Diagnosis not present

## 2020-08-07 DIAGNOSIS — F32A Depression, unspecified: Secondary | ICD-10-CM | POA: Diagnosis not present

## 2020-08-19 DIAGNOSIS — G4700 Insomnia, unspecified: Secondary | ICD-10-CM | POA: Diagnosis not present

## 2020-08-19 DIAGNOSIS — F330 Major depressive disorder, recurrent, mild: Secondary | ICD-10-CM | POA: Diagnosis not present

## 2020-08-19 DIAGNOSIS — F0390 Unspecified dementia without behavioral disturbance: Secondary | ICD-10-CM | POA: Diagnosis not present

## 2020-09-02 IMAGING — DX DG CHEST 2V
2 series · 2 of 2 positions shown · non-contrast
Comparison: January 02, 2017

CLINICAL DATA: Chest pain

EXAM:
CHEST - 2 VIEW

[chest lat]
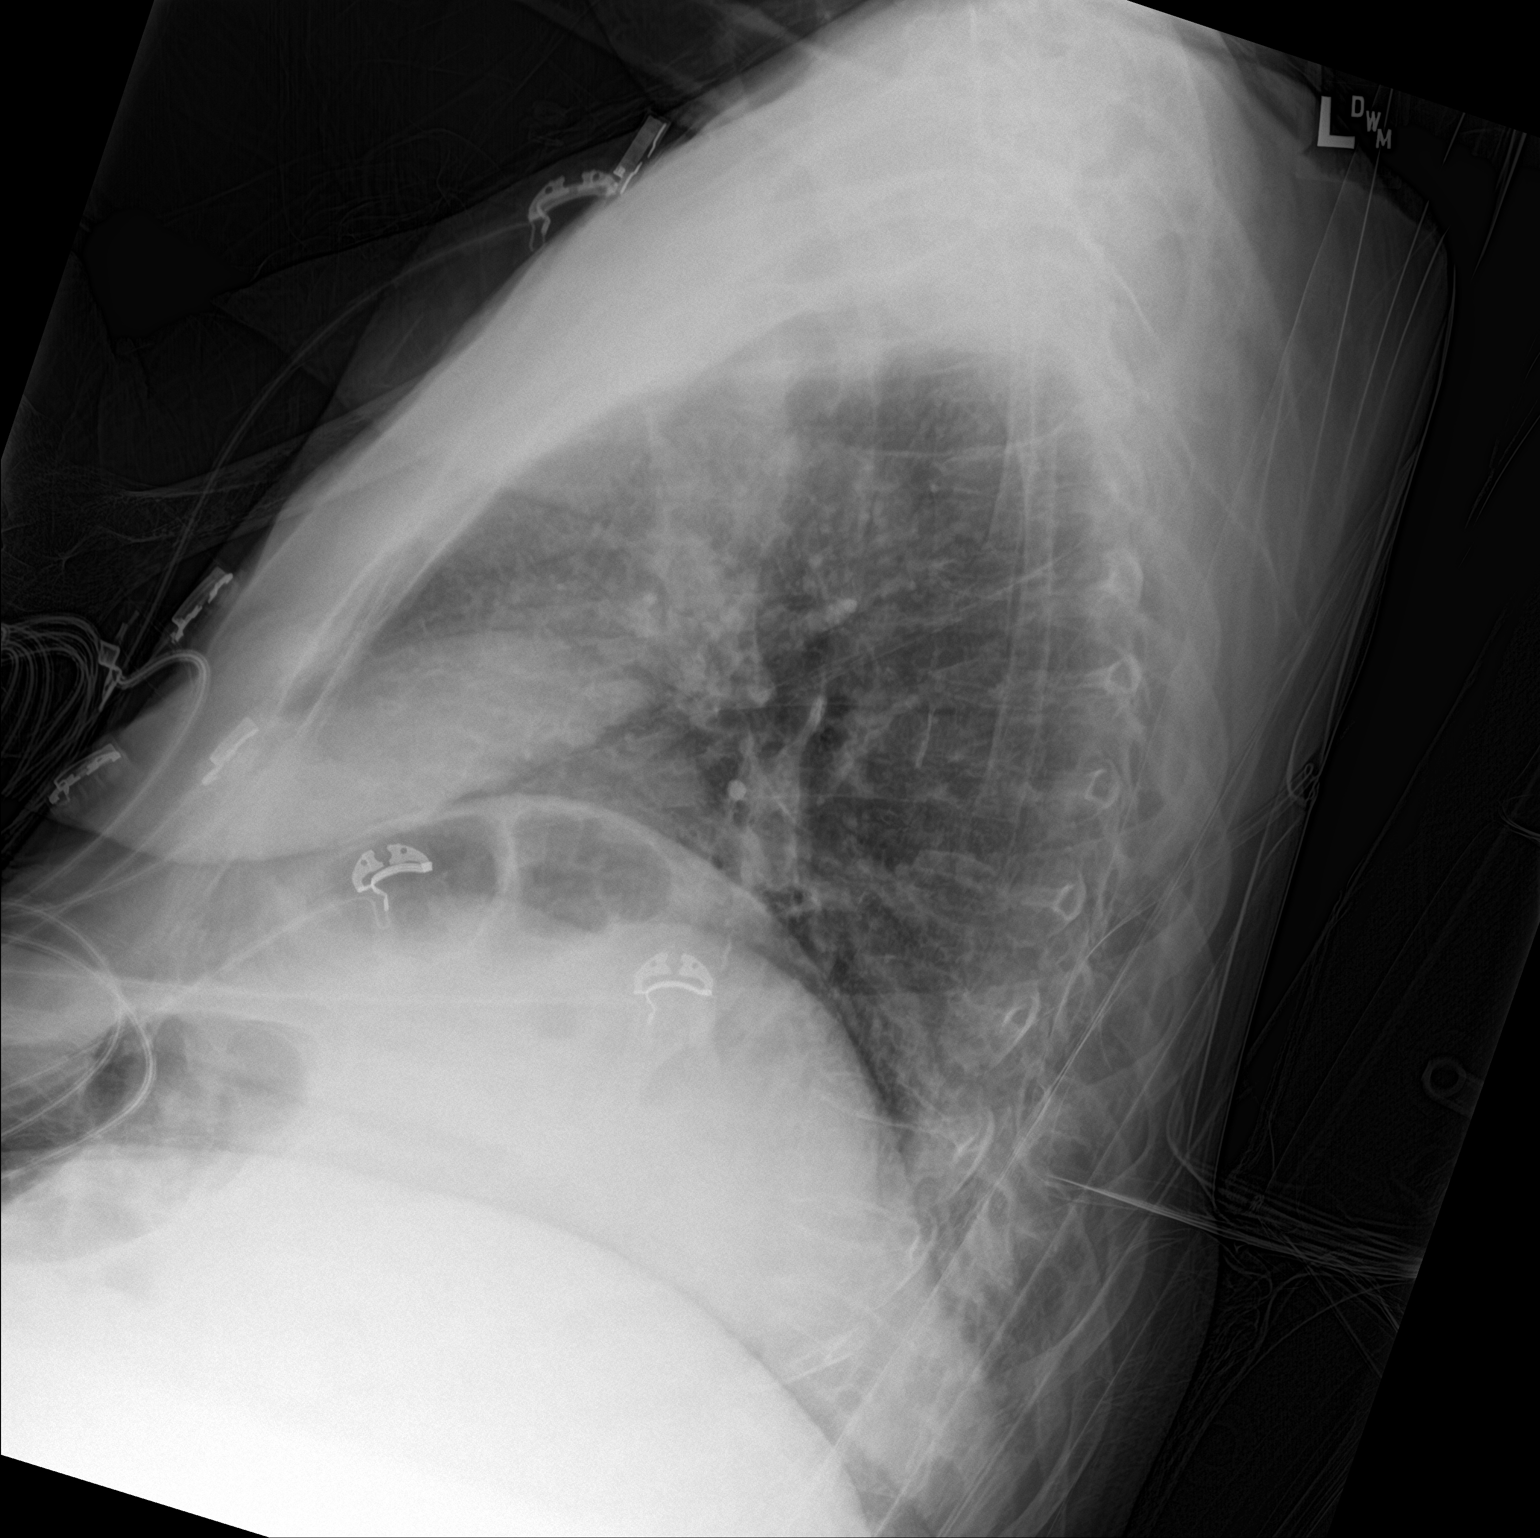

[chest ap strecther]
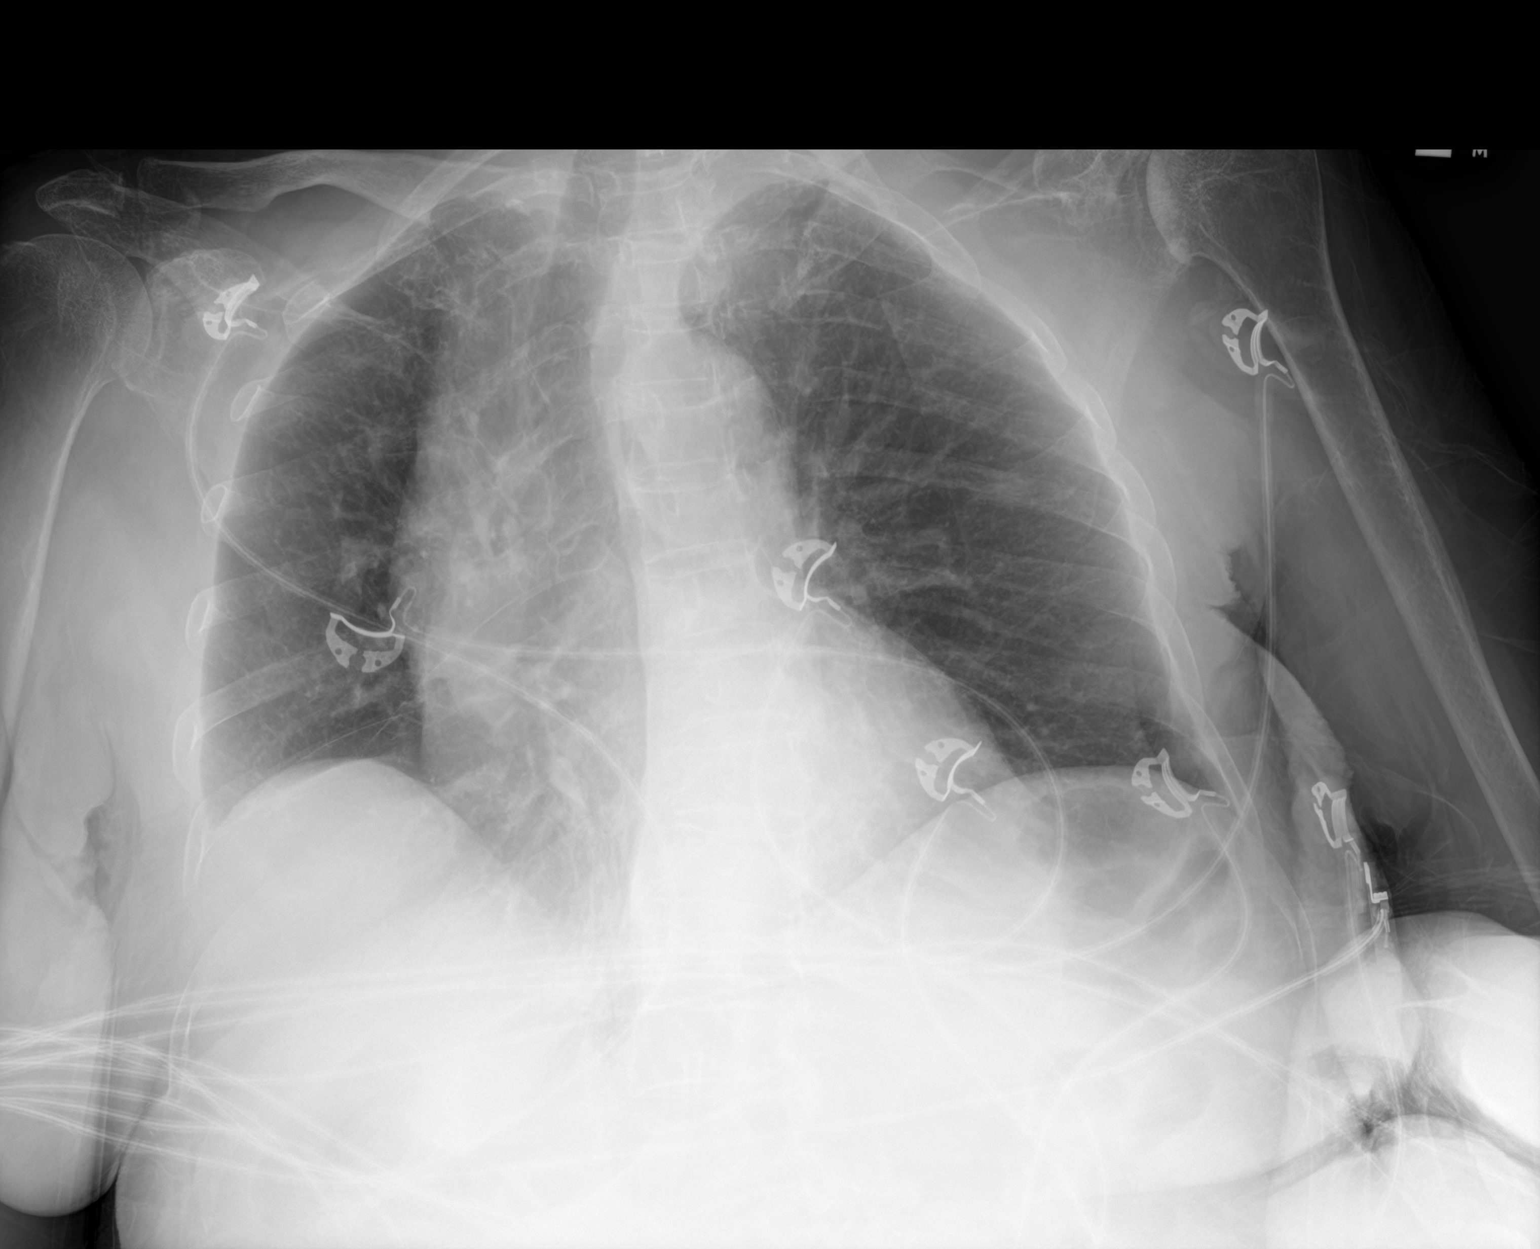

[2 of 2 positions shown; findings below may reference images not displayed]

FINDINGS: There is no edema or consolidation. Heart is upper normal in size
with pulmonary vascularity normal. No adenopathy. No pneumothorax.
No bone lesions evident. There is mild levoscoliosis in the thoracic
region.
IMPRESSION: No edema or consolidation.

## 2020-09-02 IMAGING — DX DG THORACIC SPINE 2V
3 series · 3 of 3 positions shown · non-contrast
Comparison: CT abdomen pelvis, 04/14/2016.

CLINICAL DATA: Leaned over last pm in bed started having mid back
pain on left side, hx stroke with left side deficit

EXAM:
THORACIC SPINE 2 VIEWS

[t-spine ap]
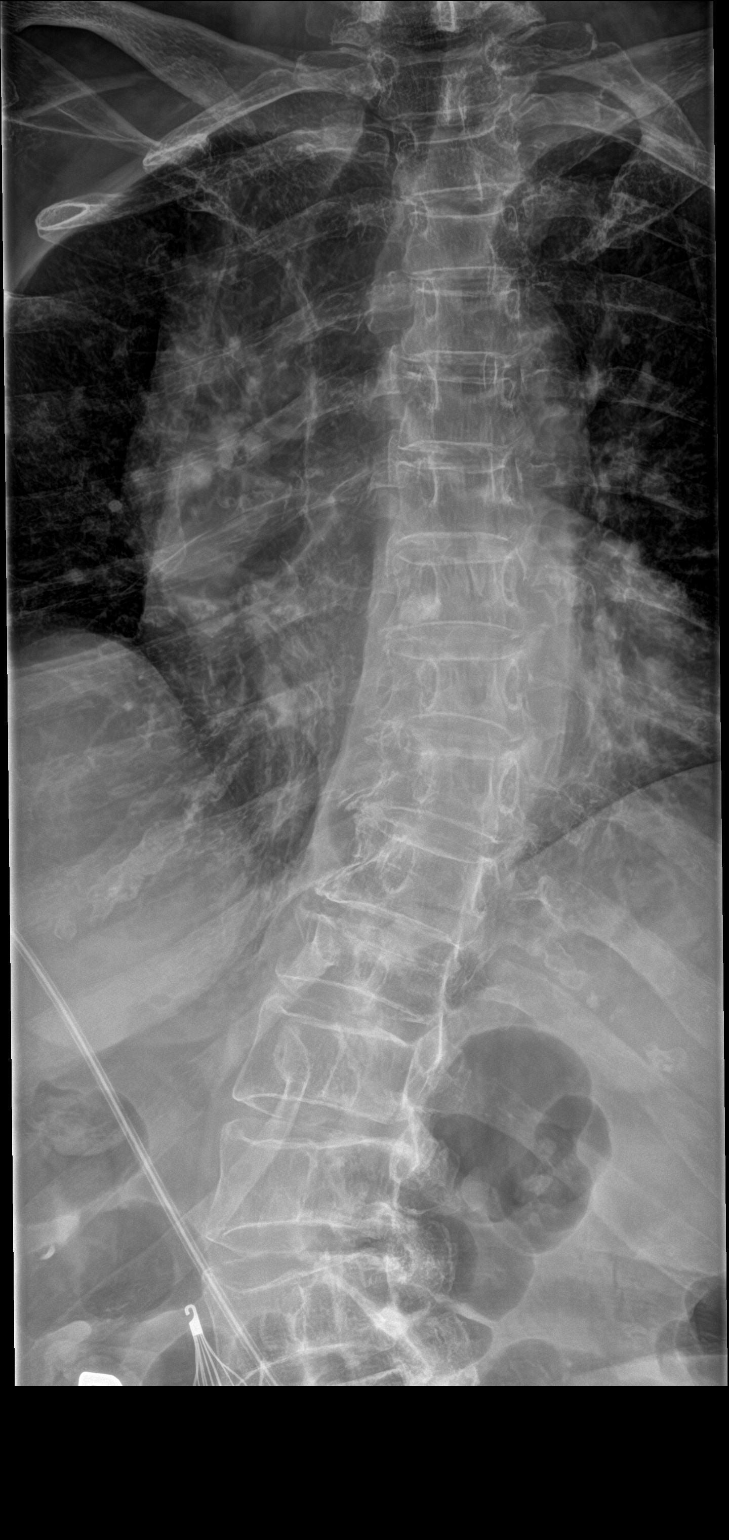

[t-spine lat]
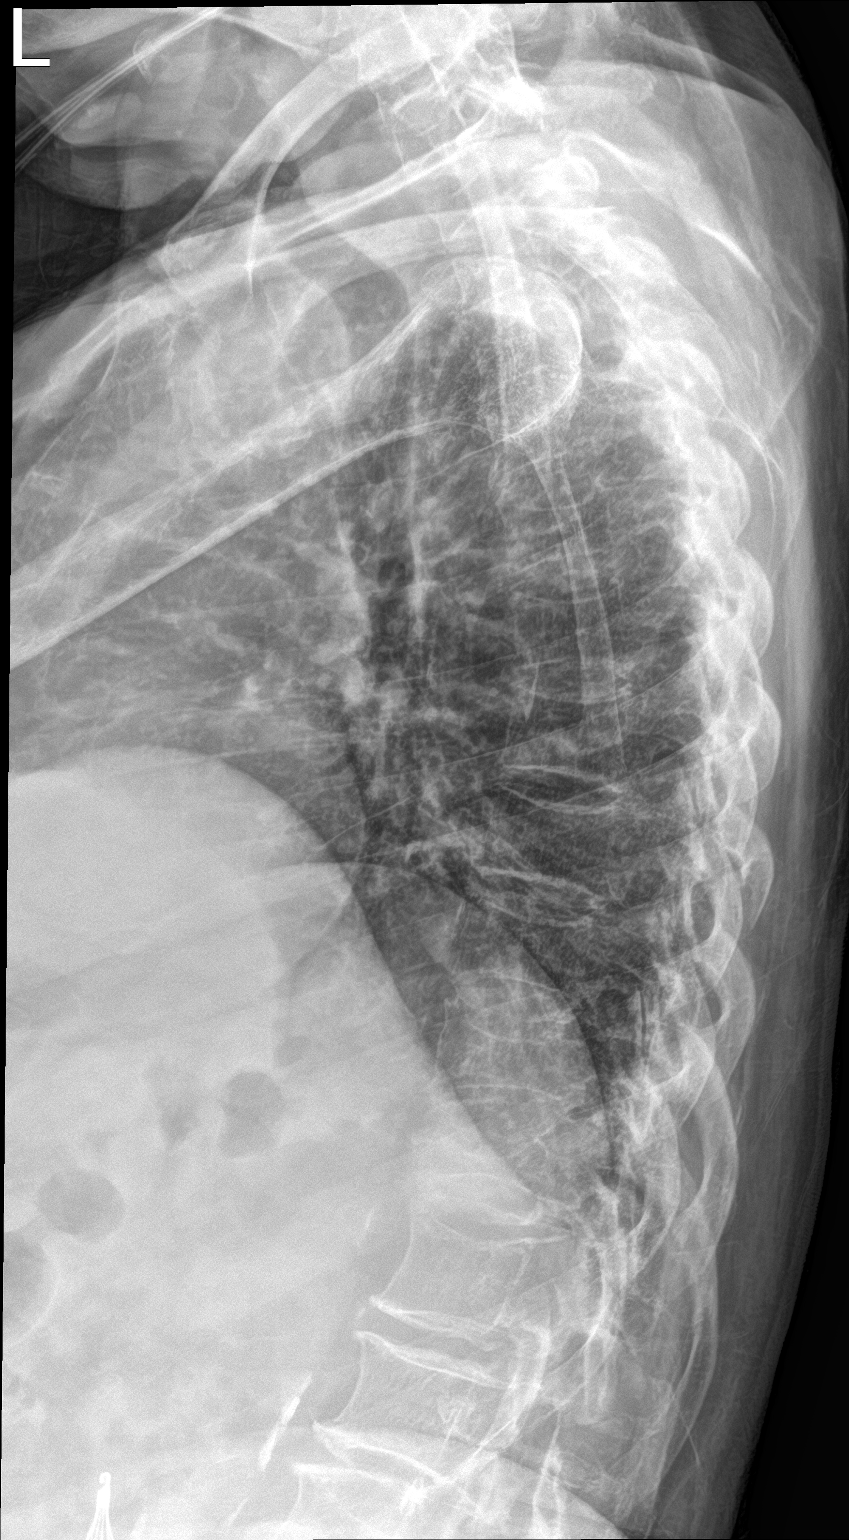

[t-spine swimmers]
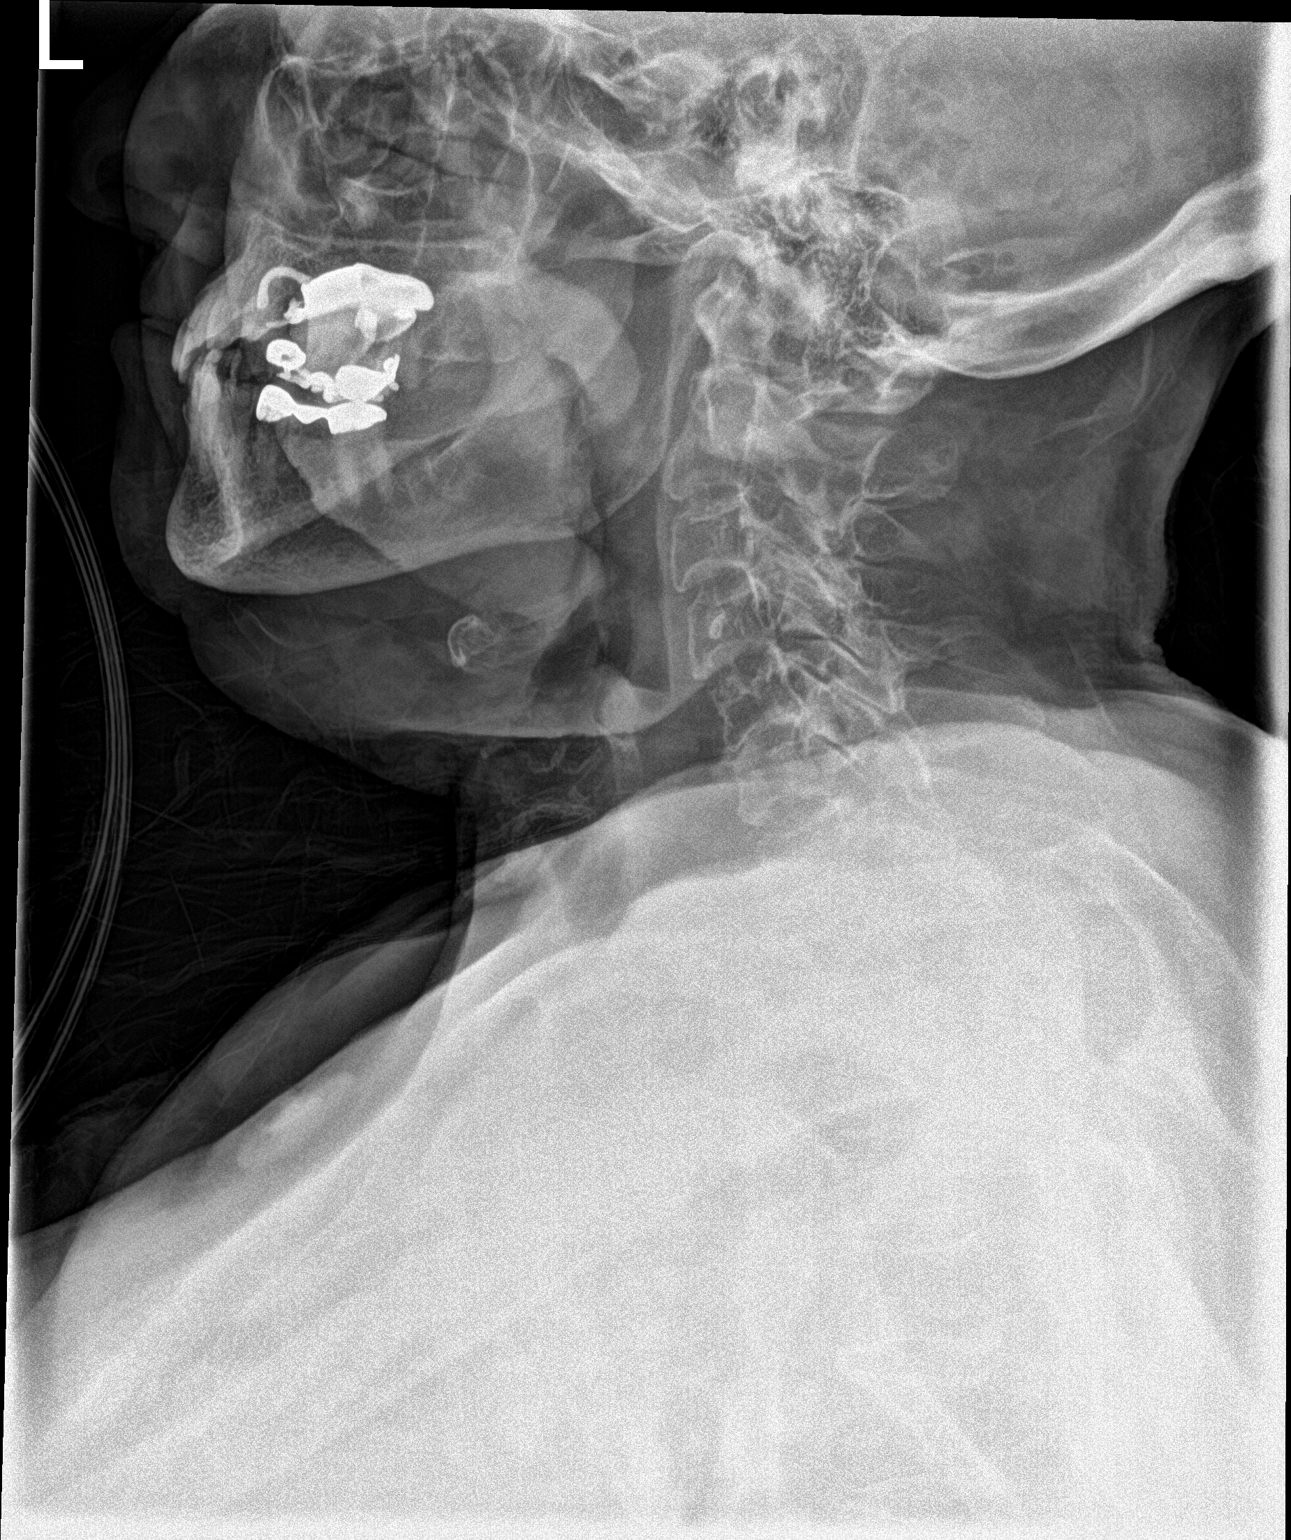

[3 of 3 positions shown; findings below may reference images not displayed]

FINDINGS: Moderate compression fracture of T11, new since the prior CT, but of
unclear chronicity. No other fractures. No spondylolisthesis. Discs
are relatively well maintained in height with no significant
degenerative change. Bones are diffusely demineralized. There is a
prominent dextroscoliosis, apex in the upper lumbar spine.

Soft tissues are unremarkable.
IMPRESSION: 1. Moderate compression fracture of T11 new since the CT dated
04/14/2016, but of unclear chronicity. It may be recent.
2. No other fractures.

## 2020-09-04 DIAGNOSIS — I872 Venous insufficiency (chronic) (peripheral): Secondary | ICD-10-CM | POA: Diagnosis not present

## 2020-09-04 DIAGNOSIS — Z85828 Personal history of other malignant neoplasm of skin: Secondary | ICD-10-CM | POA: Diagnosis not present

## 2020-09-04 DIAGNOSIS — L821 Other seborrheic keratosis: Secondary | ICD-10-CM | POA: Diagnosis not present

## 2020-09-05 DIAGNOSIS — N390 Urinary tract infection, site not specified: Secondary | ICD-10-CM | POA: Diagnosis not present

## 2020-09-05 DIAGNOSIS — R071 Chest pain on breathing: Secondary | ICD-10-CM | POA: Diagnosis not present

## 2020-09-08 DIAGNOSIS — B351 Tinea unguium: Secondary | ICD-10-CM | POA: Diagnosis not present

## 2020-09-08 DIAGNOSIS — M79671 Pain in right foot: Secondary | ICD-10-CM | POA: Diagnosis not present

## 2020-09-08 DIAGNOSIS — M79672 Pain in left foot: Secondary | ICD-10-CM | POA: Diagnosis not present

## 2020-09-17 DIAGNOSIS — M12812 Other specific arthropathies, not elsewhere classified, left shoulder: Secondary | ICD-10-CM | POA: Diagnosis not present

## 2020-09-22 DIAGNOSIS — I679 Cerebrovascular disease, unspecified: Secondary | ICD-10-CM | POA: Diagnosis not present

## 2020-09-22 DIAGNOSIS — I10 Essential (primary) hypertension: Secondary | ICD-10-CM | POA: Diagnosis not present

## 2020-09-22 DIAGNOSIS — F0150 Vascular dementia without behavioral disturbance: Secondary | ICD-10-CM | POA: Diagnosis not present

## 2020-09-22 DIAGNOSIS — F339 Major depressive disorder, recurrent, unspecified: Secondary | ICD-10-CM | POA: Diagnosis not present

## 2020-09-23 DIAGNOSIS — L814 Other melanin hyperpigmentation: Secondary | ICD-10-CM | POA: Diagnosis not present

## 2020-09-23 DIAGNOSIS — L570 Actinic keratosis: Secondary | ICD-10-CM | POA: Diagnosis not present

## 2020-09-23 DIAGNOSIS — L821 Other seborrheic keratosis: Secondary | ICD-10-CM | POA: Diagnosis not present

## 2020-09-23 DIAGNOSIS — Z85828 Personal history of other malignant neoplasm of skin: Secondary | ICD-10-CM | POA: Diagnosis not present

## 2020-10-21 DIAGNOSIS — L570 Actinic keratosis: Secondary | ICD-10-CM | POA: Diagnosis not present

## 2020-10-21 DIAGNOSIS — Z85828 Personal history of other malignant neoplasm of skin: Secondary | ICD-10-CM | POA: Diagnosis not present

## 2020-10-21 DIAGNOSIS — L814 Other melanin hyperpigmentation: Secondary | ICD-10-CM | POA: Diagnosis not present

## 2020-10-21 DIAGNOSIS — L821 Other seborrheic keratosis: Secondary | ICD-10-CM | POA: Diagnosis not present

## 2020-12-11 DIAGNOSIS — I10 Essential (primary) hypertension: Secondary | ICD-10-CM | POA: Diagnosis not present

## 2020-12-11 DIAGNOSIS — F32A Depression, unspecified: Secondary | ICD-10-CM | POA: Diagnosis not present

## 2020-12-11 DIAGNOSIS — R634 Abnormal weight loss: Secondary | ICD-10-CM | POA: Diagnosis not present

## 2020-12-11 DIAGNOSIS — G4700 Insomnia, unspecified: Secondary | ICD-10-CM | POA: Diagnosis not present

## 2020-12-11 DIAGNOSIS — I639 Cerebral infarction, unspecified: Secondary | ICD-10-CM | POA: Diagnosis not present

## 2020-12-11 DIAGNOSIS — F0390 Unspecified dementia without behavioral disturbance: Secondary | ICD-10-CM | POA: Diagnosis not present

## 2020-12-11 DIAGNOSIS — Z86718 Personal history of other venous thrombosis and embolism: Secondary | ICD-10-CM | POA: Diagnosis not present

## 2020-12-11 DIAGNOSIS — D62 Acute posthemorrhagic anemia: Secondary | ICD-10-CM | POA: Diagnosis not present

## 2020-12-11 DIAGNOSIS — N390 Urinary tract infection, site not specified: Secondary | ICD-10-CM | POA: Diagnosis not present

## 2020-12-11 DIAGNOSIS — K219 Gastro-esophageal reflux disease without esophagitis: Secondary | ICD-10-CM | POA: Diagnosis not present

## 2020-12-11 DIAGNOSIS — R52 Pain, unspecified: Secondary | ICD-10-CM | POA: Diagnosis not present

## 2020-12-16 DIAGNOSIS — F330 Major depressive disorder, recurrent, mild: Secondary | ICD-10-CM | POA: Diagnosis not present

## 2020-12-16 DIAGNOSIS — G4700 Insomnia, unspecified: Secondary | ICD-10-CM | POA: Diagnosis not present

## 2020-12-16 DIAGNOSIS — Z85828 Personal history of other malignant neoplasm of skin: Secondary | ICD-10-CM | POA: Diagnosis not present

## 2020-12-16 DIAGNOSIS — L821 Other seborrheic keratosis: Secondary | ICD-10-CM | POA: Diagnosis not present

## 2020-12-16 DIAGNOSIS — L570 Actinic keratosis: Secondary | ICD-10-CM | POA: Diagnosis not present

## 2020-12-16 DIAGNOSIS — L814 Other melanin hyperpigmentation: Secondary | ICD-10-CM | POA: Diagnosis not present

## 2020-12-16 DIAGNOSIS — F0390 Unspecified dementia without behavioral disturbance: Secondary | ICD-10-CM | POA: Diagnosis not present

## 2020-12-30 DIAGNOSIS — R404 Transient alteration of awareness: Secondary | ICD-10-CM | POA: Diagnosis not present

## 2020-12-30 DIAGNOSIS — I10 Essential (primary) hypertension: Secondary | ICD-10-CM | POA: Diagnosis not present

## 2020-12-30 DIAGNOSIS — I252 Old myocardial infarction: Secondary | ICD-10-CM | POA: Diagnosis not present

## 2020-12-30 DIAGNOSIS — S22050A Wedge compression fracture of T5-T6 vertebra, initial encounter for closed fracture: Secondary | ICD-10-CM | POA: Diagnosis not present

## 2020-12-30 DIAGNOSIS — Q282 Arteriovenous malformation of cerebral vessels: Secondary | ICD-10-CM | POA: Diagnosis not present

## 2020-12-30 DIAGNOSIS — R519 Headache, unspecified: Secondary | ICD-10-CM | POA: Diagnosis not present

## 2020-12-30 DIAGNOSIS — I129 Hypertensive chronic kidney disease with stage 1 through stage 4 chronic kidney disease, or unspecified chronic kidney disease: Secondary | ICD-10-CM | POA: Diagnosis not present

## 2020-12-30 DIAGNOSIS — M4184 Other forms of scoliosis, thoracic region: Secondary | ICD-10-CM | POA: Diagnosis not present

## 2020-12-30 DIAGNOSIS — Z20822 Contact with and (suspected) exposure to covid-19: Secondary | ICD-10-CM | POA: Diagnosis not present

## 2020-12-30 DIAGNOSIS — I69354 Hemiplegia and hemiparesis following cerebral infarction affecting left non-dominant side: Secondary | ICD-10-CM | POA: Diagnosis not present

## 2020-12-30 DIAGNOSIS — N183 Chronic kidney disease, stage 3 unspecified: Secondary | ICD-10-CM | POA: Diagnosis not present

## 2020-12-30 DIAGNOSIS — E785 Hyperlipidemia, unspecified: Secondary | ICD-10-CM | POA: Diagnosis not present

## 2020-12-30 DIAGNOSIS — M8008XA Age-related osteoporosis with current pathological fracture, vertebra(e), initial encounter for fracture: Secondary | ICD-10-CM | POA: Diagnosis not present

## 2020-12-30 DIAGNOSIS — R1310 Dysphagia, unspecified: Secondary | ICD-10-CM | POA: Diagnosis not present

## 2020-12-30 DIAGNOSIS — S22089A Unspecified fracture of T11-T12 vertebra, initial encounter for closed fracture: Secondary | ICD-10-CM | POA: Diagnosis not present

## 2020-12-30 DIAGNOSIS — F0390 Unspecified dementia without behavioral disturbance: Secondary | ICD-10-CM | POA: Diagnosis not present

## 2020-12-30 DIAGNOSIS — R569 Unspecified convulsions: Secondary | ICD-10-CM | POA: Diagnosis not present

## 2020-12-30 DIAGNOSIS — Z794 Long term (current) use of insulin: Secondary | ICD-10-CM | POA: Diagnosis not present

## 2020-12-30 DIAGNOSIS — J90 Pleural effusion, not elsewhere classified: Secondary | ICD-10-CM | POA: Diagnosis not present

## 2020-12-30 DIAGNOSIS — R5381 Other malaise: Secondary | ICD-10-CM | POA: Diagnosis not present

## 2020-12-30 DIAGNOSIS — M546 Pain in thoracic spine: Secondary | ICD-10-CM | POA: Diagnosis not present

## 2020-12-30 DIAGNOSIS — G9341 Metabolic encephalopathy: Secondary | ICD-10-CM | POA: Diagnosis not present

## 2020-12-30 DIAGNOSIS — E1122 Type 2 diabetes mellitus with diabetic chronic kidney disease: Secondary | ICD-10-CM | POA: Diagnosis not present

## 2020-12-30 DIAGNOSIS — I671 Cerebral aneurysm, nonruptured: Secondary | ICD-10-CM | POA: Diagnosis not present

## 2020-12-30 DIAGNOSIS — G319 Degenerative disease of nervous system, unspecified: Secondary | ICD-10-CM | POA: Diagnosis not present

## 2020-12-30 DIAGNOSIS — I639 Cerebral infarction, unspecified: Secondary | ICD-10-CM | POA: Diagnosis not present

## 2020-12-30 DIAGNOSIS — E871 Hypo-osmolality and hyponatremia: Secondary | ICD-10-CM | POA: Diagnosis not present

## 2020-12-30 DIAGNOSIS — X58XXXA Exposure to other specified factors, initial encounter: Secondary | ICD-10-CM | POA: Diagnosis not present

## 2020-12-30 DIAGNOSIS — M4854XA Collapsed vertebra, not elsewhere classified, thoracic region, initial encounter for fracture: Secondary | ICD-10-CM | POA: Diagnosis not present

## 2020-12-30 DIAGNOSIS — R4182 Altered mental status, unspecified: Secondary | ICD-10-CM | POA: Diagnosis not present

## 2020-12-30 DIAGNOSIS — Z515 Encounter for palliative care: Secondary | ICD-10-CM | POA: Diagnosis not present

## 2020-12-30 DIAGNOSIS — R0902 Hypoxemia: Secondary | ICD-10-CM | POA: Diagnosis not present

## 2020-12-30 DIAGNOSIS — I6522 Occlusion and stenosis of left carotid artery: Secondary | ICD-10-CM | POA: Diagnosis not present

## 2020-12-30 DIAGNOSIS — N390 Urinary tract infection, site not specified: Secondary | ICD-10-CM | POA: Diagnosis not present

## 2020-12-30 DIAGNOSIS — Z743 Need for continuous supervision: Secondary | ICD-10-CM | POA: Diagnosis not present

## 2020-12-30 DIAGNOSIS — J9811 Atelectasis: Secondary | ICD-10-CM | POA: Diagnosis not present

## 2020-12-30 DIAGNOSIS — S22060A Wedge compression fracture of T7-T8 vertebra, initial encounter for closed fracture: Secondary | ICD-10-CM | POA: Diagnosis not present

## 2020-12-30 DIAGNOSIS — G40909 Epilepsy, unspecified, not intractable, without status epilepticus: Secondary | ICD-10-CM | POA: Diagnosis not present

## 2020-12-30 DIAGNOSIS — I2510 Atherosclerotic heart disease of native coronary artery without angina pectoris: Secondary | ICD-10-CM | POA: Diagnosis not present

## 2020-12-30 DIAGNOSIS — Z66 Do not resuscitate: Secondary | ICD-10-CM | POA: Diagnosis not present

## 2020-12-30 DIAGNOSIS — F325 Major depressive disorder, single episode, in full remission: Secondary | ICD-10-CM | POA: Diagnosis not present

## 2020-12-30 DIAGNOSIS — R410 Disorientation, unspecified: Secondary | ICD-10-CM | POA: Diagnosis not present

## 2020-12-30 DIAGNOSIS — Z8679 Personal history of other diseases of the circulatory system: Secondary | ICD-10-CM | POA: Diagnosis not present

## 2020-12-30 DIAGNOSIS — E119 Type 2 diabetes mellitus without complications: Secondary | ICD-10-CM | POA: Diagnosis not present

## 2020-12-30 DIAGNOSIS — K219 Gastro-esophageal reflux disease without esophagitis: Secondary | ICD-10-CM | POA: Diagnosis not present

## 2020-12-30 DIAGNOSIS — R279 Unspecified lack of coordination: Secondary | ICD-10-CM | POA: Diagnosis not present

## 2020-12-30 DIAGNOSIS — Y998 Other external cause status: Secondary | ICD-10-CM | POA: Diagnosis not present

## 2020-12-30 DIAGNOSIS — I517 Cardiomegaly: Secondary | ICD-10-CM | POA: Diagnosis not present

## 2020-12-30 DIAGNOSIS — M8448XA Pathological fracture, other site, initial encounter for fracture: Secondary | ICD-10-CM | POA: Diagnosis not present

## 2021-01-16 DIAGNOSIS — G4700 Insomnia, unspecified: Secondary | ICD-10-CM | POA: Diagnosis not present

## 2021-01-16 DIAGNOSIS — R569 Unspecified convulsions: Secondary | ICD-10-CM | POA: Diagnosis not present

## 2021-01-16 DIAGNOSIS — N390 Urinary tract infection, site not specified: Secondary | ICD-10-CM | POA: Diagnosis not present

## 2021-01-16 DIAGNOSIS — I671 Cerebral aneurysm, nonruptured: Secondary | ICD-10-CM | POA: Diagnosis not present

## 2021-01-16 DIAGNOSIS — I69354 Hemiplegia and hemiparesis following cerebral infarction affecting left non-dominant side: Secondary | ICD-10-CM | POA: Diagnosis not present

## 2021-01-16 DIAGNOSIS — F32A Depression, unspecified: Secondary | ICD-10-CM | POA: Diagnosis not present

## 2021-01-16 DIAGNOSIS — F0390 Unspecified dementia without behavioral disturbance: Secondary | ICD-10-CM | POA: Diagnosis not present

## 2021-01-16 DIAGNOSIS — I10 Essential (primary) hypertension: Secondary | ICD-10-CM | POA: Diagnosis not present

## 2021-02-20 DIAGNOSIS — F0390 Unspecified dementia without behavioral disturbance: Secondary | ICD-10-CM | POA: Diagnosis not present

## 2021-02-20 DIAGNOSIS — R569 Unspecified convulsions: Secondary | ICD-10-CM | POA: Diagnosis not present

## 2021-02-20 DIAGNOSIS — I671 Cerebral aneurysm, nonruptured: Secondary | ICD-10-CM | POA: Diagnosis not present

## 2021-02-20 DIAGNOSIS — I69354 Hemiplegia and hemiparesis following cerebral infarction affecting left non-dominant side: Secondary | ICD-10-CM | POA: Diagnosis not present

## 2021-02-20 DIAGNOSIS — I10 Essential (primary) hypertension: Secondary | ICD-10-CM | POA: Diagnosis not present

## 2021-02-24 DIAGNOSIS — F419 Anxiety disorder, unspecified: Secondary | ICD-10-CM | POA: Diagnosis not present

## 2021-03-18 DEATH — deceased
